# Patient Record
Sex: Female | Born: 1995 | Hispanic: Yes | Marital: Married | Smoking: Never smoker
Health system: Southern US, Community
[De-identification: ages and names within clinical notes are randomized; demographics above are authoritative.]

## PROBLEM LIST (undated history)

## (undated) DIAGNOSIS — Z789 Other specified health status: Secondary | ICD-10-CM

## (undated) HISTORY — PX: NO PAST SURGERIES: SHX2092

---

## 2016-05-10 ENCOUNTER — Ambulatory Visit (INDEPENDENT_AMBULATORY_CARE_PROVIDER_SITE_OTHER): Payer: Managed Care, Other (non HMO) | Admitting: Osteopathic Medicine

## 2016-05-10 ENCOUNTER — Encounter: Payer: Self-pay | Admitting: Osteopathic Medicine

## 2016-05-10 VITALS — BP 126/73 | HR 79 | Ht 59.0 in | Wt 123.0 lb

## 2016-05-10 DIAGNOSIS — Z113 Encounter for screening for infections with a predominantly sexual mode of transmission: Secondary | ICD-10-CM

## 2016-05-10 DIAGNOSIS — Z0189 Encounter for other specified special examinations: Secondary | ICD-10-CM | POA: Diagnosis not present

## 2016-05-10 DIAGNOSIS — Z124 Encounter for screening for malignant neoplasm of cervix: Secondary | ICD-10-CM | POA: Diagnosis not present

## 2016-05-10 DIAGNOSIS — Z0000 Encounter for general adult medical examination without abnormal findings: Secondary | ICD-10-CM

## 2016-05-10 DIAGNOSIS — Z23 Encounter for immunization: Secondary | ICD-10-CM

## 2016-05-10 DIAGNOSIS — R010 Benign and innocent cardiac murmurs: Secondary | ICD-10-CM | POA: Diagnosis not present

## 2016-05-10 DIAGNOSIS — Z008 Encounter for other general examination: Secondary | ICD-10-CM

## 2016-05-10 DIAGNOSIS — R350 Frequency of micturition: Secondary | ICD-10-CM | POA: Diagnosis not present

## 2016-05-10 LAB — CBC WITH DIFFERENTIAL/PLATELET
BASOS PCT: 1 %
Basophils Absolute: 57 cells/uL (ref 0–200)
EOS ABS: 171 {cells}/uL (ref 15–500)
Eosinophils Relative: 3 %
HCT: 40.3 % (ref 35.0–45.0)
Hemoglobin: 13.2 g/dL (ref 11.7–15.5)
Lymphocytes Relative: 42 %
Lymphs Abs: 2394 cells/uL (ref 850–3900)
MCH: 29.9 pg (ref 27.0–33.0)
MCHC: 32.8 g/dL (ref 32.0–36.0)
MCV: 91.2 fL (ref 80.0–100.0)
MONOS PCT: 7 %
MPV: 11.6 fL (ref 7.5–12.5)
Monocytes Absolute: 399 cells/uL (ref 200–950)
NEUTROS ABS: 2679 {cells}/uL (ref 1500–7800)
Neutrophils Relative %: 47 %
PLATELETS: 243 10*3/uL (ref 140–400)
RBC: 4.42 MIL/uL (ref 3.80–5.10)
RDW: 13 % (ref 11.0–15.0)
WBC: 5.7 10*3/uL (ref 3.8–10.8)

## 2016-05-10 LAB — POCT URINALYSIS DIPSTICK
Bilirubin, UA: NEGATIVE
GLUCOSE UA: NEGATIVE
Ketones, UA: NEGATIVE
NITRITE UA: NEGATIVE
PH UA: 7.5
PROTEIN UA: NEGATIVE
Spec Grav, UA: 1.015
UROBILINOGEN UA: 0.2

## 2016-05-10 NOTE — Patient Instructions (Signed)
Plan:  You should receive a call about the echocardiogram to evaluate the heart murmur We will send the paperwork for your blood tests once we have the results Please bring us any vaccine records you might have Will call you with lab results    Soplo cardaco (Heart Murmur) Un soplo cardaco es un sonido adicional que su mdico oye cuando escucha su corazn con un dispositivo denominado estetoscopio. El sonido proviene de una turbulencia provocada cuando la sangre circula a travs del corazn y el sonido puede ser como un zumbido o un silbido cuando el corazn late. Existen dos tipos de soplos cardacos:  Soplo cardaco inocente. La mayora de las personas con este tipo de soplo no tienen un problema cardaco. Muchos nios tienen soplos cardacos inocentes. Su mdico puede sugerir Jabil Circuitalgunos estudios bsicos para saber si el soplo es un soplo inocente. Si se descubre un soplo cardaco inocente, no hay necesidad de Sears Holdings Corporationhacer otros estudios, Tax inspectoradministrar tratamiento, restringir las actividades ni suspender la prctica de deportes.  Soplo cardaco anormal. Este tipo de soplo cardaco puede aparecer en nios y adultos. En los nios, por lo general los soplos cardacos anormales son causados por defectos cardacos que estn presentes desde el nacimiento (congnitos). En los adultos, los soplos anormales normalmente provienen de problemas en las vlvulas cardacas causados por una enfermedad, infeccin o el envejecimiento. CAUSAS Normalmente, estas vlvulas se abren para dejar que la sangre circule a travs o fuera del corazn y Engineer, miningluego se cierran para evitar que la sangre regrese. Si las vlvulas no funcionan correctamente, puede experimentar los siguientes sntomas:  Regurgitacin: cuando la sangre se filtra a travs de la vlvula en la direccin incorrecta.  Prolapso de la vlvula mitral: cuando la vlvula mitral del corazn tiene una aleta suelta y no se cierra hermticamente.  Estenosis: cuando la vlvula  no se abre lo suficiente y bloquea el flujo sanguneo. Blake DivineSIGNOS Y SNTOMAS Los soplos inocentes no provocan sntomas, y Alexandratownmuchas personas con soplos anormales pueden tener sntomas o no. Si hay sntomas, pueden ser:  Harrel LemonFalta de aire.  Color azul en la piel, especialmente en las puntas de los dedos.  Dolor en el pecho.  Palpitaciones, sentir un aleteo o latido cardaco irregular.  Desmayos.  Tos persistente.  Cansarse con mucha ms rapidez de lo esperado. DIAGNSTICO Un soplo cardaco se puede escuchar durante un examen mdico deportivo o durante otro tipo de examen. Cuando se escucha un soplo, esto puede sugerir un posible problema. Cuando esto ocurre, es posible que el mdico le recomiende ver a Journalist, newspaperun especialista cardaco (cardilogo). Tambin le pueden pedir que se realice uno o ms estudios cardacos. En Franklin Resourcesestos casos, los estudios pueden variar segn lo que escuch su mdico. Estos son algunos de los estudios indicados para un soplo cardaco:  Materials engineerlectrocardiograma.  Ecocardiograma.  Resonancia magntica. Para nios y adultos que tienen un soplo cardaco anormal y desean Microbiologistpracticar deportes, es importante completar los estudios, Chiropractoranalizar los Clovisresultados con su mdico y seguir sus TEFL teacherrecomendaciones. Si hay una enfermedad cardaca, es posible que no sea seguro practicar un deporte. TRATAMIENTO Los soplos inocentes no requieren tratamiento ni restriccin de actividades. Si un soplo anormal representa un problema en el corazn, el tratamiento depender de la naturaleza exacta del problema. En Franklin Resourcesestos casos, es posible que sean necesarios medicamentos o ciruga para tratar el problema. INSTRUCCIONES PARA EL CUIDADO EN EL HOGAR Si desea practicar un deporte u otros tipos de [place] Vincent and the Grenadinesactividad fsica intensa, es importante analizar esta situacin primero con su mdico. Si el  soplo representa un problema en el corazn y decide practicar un deporte, existe una pequea probabilidad de que suceda un problema grave  (incluida muerte sbita). SOLICITE ATENCIN MDICA SI:  Siente que sus sntomas empeoran lentamente.  Desarrolla sntomas nuevos que le preocupan.  Siente que los medicamentos recetados le provocan efectos secundarios. SOLICITE ATENCIN MDICA DE INMEDIATO SI:  Siente dolor en el pecho.  Le falta el aire.  Observa que su corazn late de forma irregular, con una frecuencia preocupante  Sufre episodios de [employer].  Los sntomas empeoran repentinamente. Esta informacin no tiene Theme park manager el consejo del mdico. Asegrese de hacerle al mdico cualquier pregunta que tenga. Document Released: 02/15/2005 Document Revised: 03/08/2014 Document Reviewed: 08/21/2015 Elsevier Interactive Patient Education  2017 [employer].

## 2016-05-10 NOTE — Progress Notes (Signed)
HPI: Kristin Mcdaniel is a 21 y.o. female  who presents to [facility] today, 05/10/16,  for chief complaint of:  Chief Complaint  Patient presents with  . Annual Exam    See below for review of preventive care   Urinary frequency - no dysuria, thinks may be due to holding her urine while at work. Trace leuks on UA in office. No fever or nausea, no abd pain.   STI check - would like to be tested. Never had Pap. Declines Pap today.   Interpreter present and assists with history.    Past medical, surgical, social and family history reviewed: There are no active problems to display for this patient.  History reviewed. No pertinent surgical history. Social History  Substance Use Topics  . Smoking status: Never Smoker  . Smokeless tobacco: Never Used  . Alcohol use No     Comment: occasional, social drinking, limited    Family History  Problem Relation Age of Onset  . Diabetes Mother   . Hyperlipidemia Mother   . Diabetes Maternal Grandmother   . Hyperlipidemia Maternal Grandmother      Current medication list and allergy/intolerance information reviewed:   No current outpatient prescriptions on file.   No current facility-administered medications for this visit.    No Known Allergies    Review of Systems:  Constitutional:  No  fever, no chills, No recent illness  HEENT: No  headache, no vision change, no hearing change, No sore throat, No  sinus pressure  Cardiac: No  chest pain, No  pressure, No palpitations, No  Orthopnea  Respiratory:  No  shortness of breath. No  Cough  Gastrointestinal: No  abdominal pain, No  nausea, No  vomiting,  No  blood in stool, No  diarrhea, No  constipation   Musculoskeletal: No new myalgia/arthralgia  Skin: No  Rash, No other wounds/concerning lesions  Neurologic: No  weakness, No  dizziness  Psychiatric: No  concerns with depression, No  concerns with anxiety, No sleep problems, No mood  problems  Exam:  BP 126/73   Pulse 79   Ht 4\' 11"  (1.499 m)   Wt 123 lb (55.8 kg)   BMI 24.84 kg/m   Constitutional: VS see above. General Appearance: alert, well-developed, well-nourished, NAD  Eyes: Normal lids and conjunctive, non-icteric sclera  Ears, Nose, Mouth, Throat: MMM, Normal external inspection ears/nares/mouth/lips/gums. TM normal bilaterally. Pharynx/tonsils no erythema, no exudate. Nasal mucosa normal.   Neck: No masses, trachea midline. No thyroid enlargement. No tenderness/mass appreciated. No lymphadenopathy  Respiratory: Normal respiratory effort. no wheeze, no rhonchi, no rales  Cardiovascular: S1/S2 normal, +1 murmur, no rub/gallop auscultated. RRR. No lower extremity edema.   Gastrointestinal: Nontender, no masses. No hepatomegaly, no splenomegaly. No hernia appreciated. Bowel sounds normal. Rectal exam deferred.   Musculoskeletal: Gait normal. No clubbing/cyanosis of digits.   Neurological: Normal balance/coordination. No tremor. No cranial nerve deficit on limited exam.  Skin: warm, dry, intact. No rash/ulcer.  Psychiatric: Normal judgment/insight. Normal mood and affect. Oriented x3.     FEMALE PREVENTIVE CARE Updated 05/10/16   ANNUAL SCREENING/COUNSELING  Diet/Exercise - HEALTHY HABITS DISCUSSED TO DECREASE CV RISK History  Smoking Status  . Never Smoker  Smokeless Tobacco  . Never Used   History  Alcohol Use No    Comment: occasional, social drinking, limited    No flowsheet data found.  Domestic violence concerns - no  HTN SCREENING - SEE VITALS  SEXUAL HEALTH  Sexually active  in the past year - Yes with female.  Need/want STI testing today? - yes  Concerns about libido or pain with sex? - no  Plans for pregnancy? - none, BC at this time is condoms  INFECTIOUS DISEASE SCREENING  HIV - needs  GC/CT - needs  HepC - DOB 1946-1965 - does not need  TB - does not need  DISEASE SCREENING  Lipid - needs for  paperwork  DM2 - needs for paperwork  Osteoporosis - women age 27+ - does not need  CANCER SCREENING  Cervical - needs - never had Pap   Breast - does not need  Lung - does not need  Colon - does not need  ADULT VACCINATION  Immigrant wellness check - got three vaccines, not sure which ones    Influenza - annual vaccine recommended - never had vaccine   Td - booster every 10 years   Zoster - option at 4950, yes at 60+   PCV13 - was not indicated  PPSV23 - was not indicated  OTHER  Fall - exercise and Vit D age 27+ - does not need  Consider ASA - age 22-59 - does not need  Results for orders placed or performed in visit on 05/10/16 (from the past 24 hour(s))  POCT Urinalysis Dipstick     Status: Abnormal   Collection Time: 05/10/16  2:09 PM  Result Value Ref Range   Color, UA YELLOW    Clarity, UA CLEAR    Glucose, UA NEGATIVE    Bilirubin, UA NEGATIVE    Ketones, UA NEGATIVE    Spec Grav, UA 1.015 1.003, 1.005, 1.010, 1.015, 1.020, 1.025, 1.030, 1.035   Blood, UA SMALL    pH, UA 7.5 5.0, 5.5, 6.0, 6.5, 7.0, 7.5, 8.0   Protein, UA NEGATIVE    Urobilinogen, UA 0.2 0.2, 1.0   Nitrite, UA NEGATIVE    Leukocytes, UA Trace (A) Negative     ASSESSMENT/PLAN:   Annual physical exam - Plan: COMPLETE METABOLIC PANEL WITH GFR, Lipid panel  Need for influenza vaccination - Plan: Flu Vaccine QUAD 36+ mos IM, CANCELED: POCT Influenza A/B  Encounter for biometric screening - Plan: COMPLETE METABOLIC PANEL WITH GFR, Lipid panel  Routine screening for STI (sexually transmitted infection) - Plan: Hepatitis B core antibody, total, Hepatitis B surface antibody, Hepatitis B surface antigen, Hepatitis C antibody, RPR, HIV antibody, GC/Chlamydia Probe Amp  Urinary frequency - Plan: POCT Urinalysis Dipstick, Urine Culture  Benign heart murmur - Plan: ECHOCARDIOGRAM COMPLETE, CBC with Differential/Platelet, TSH  Cervical cancer screening - declines Pap today, due for this.  Will come back for it.     Patient Instructions  Plan:  You should receive a call about the echocardiogram to evaluate the heart murmur We will send the paperwork for your blood tests once we have the results Please bring us any vaccine records you might have Will call you with lab results   Interpreter reviewed AVS with patient    Visit summary with medication list and pertinent instructions was printed for patient to review. All questions at time of visit were answered - patient instructed to contact office with any additional concerns. ER/RTC precautions were reviewed with the patient. Follow-up plan: Return for Pap test at your convenience, annual physical every year, sooner if needed .

## 2016-05-11 LAB — COMPLETE METABOLIC PANEL WITH GFR
ALBUMIN: 4.6 g/dL (ref 3.6–5.1)
ALK PHOS: 60 U/L (ref 33–115)
ALT: 31 U/L — AB (ref 6–29)
AST: 17 U/L (ref 10–30)
BUN: 11 mg/dL (ref 7–25)
CALCIUM: 9.7 mg/dL (ref 8.6–10.2)
CHLORIDE: 104 mmol/L (ref 98–110)
CO2: 25 mmol/L (ref 20–31)
CREATININE: 0.66 mg/dL (ref 0.50–1.10)
GFR, Est Non African American: >89 mL/min (ref >=60)
Glucose, Bld: 83 mg/dL (ref 65–99)
POTASSIUM: 3.7 mmol/L (ref 3.5–5.3)
Sodium: 139 mmol/L (ref 135–146)
Total Bilirubin: 0.8 mg/dL (ref 0.2–1.2)
Total Protein: 7.4 g/dL (ref 6.1–8.1)

## 2016-05-11 LAB — HEPATITIS B SURFACE ANTIGEN: HEP B S AG: NEGATIVE

## 2016-05-11 LAB — LIPID PANEL
Cholesterol: 180 mg/dL (ref <200)
HDL: 64 mg/dL (ref >50)
LDL Cholesterol: 99 mg/dL (ref <100)
TRIGLYCERIDES: 83 mg/dL (ref <150)
Total CHOL/HDL Ratio: 2.8 Ratio (ref <5.0)
VLDL: 17 mg/dL (ref <30)

## 2016-05-11 LAB — GC/CHLAMYDIA PROBE AMP
CT PROBE, AMP APTIMA: NOT DETECTED
GC PROBE AMP APTIMA: NOT DETECTED

## 2016-05-11 LAB — TSH: TSH: 1.64 m[IU]/L

## 2016-05-11 LAB — RPR

## 2016-05-11 LAB — URINE CULTURE

## 2016-05-11 LAB — HEPATITIS C ANTIBODY: HCV AB: NEGATIVE

## 2016-05-11 LAB — HEPATITIS B CORE ANTIBODY, TOTAL: Hep B Core Total Ab: NONREACTIVE

## 2016-05-11 LAB — HIV ANTIBODY (ROUTINE TESTING W REFLEX): HIV: NONREACTIVE

## 2016-05-11 LAB — HEPATITIS B SURFACE ANTIBODY,QUALITATIVE: Hep B S Ab: NEGATIVE

## 2016-06-02 ENCOUNTER — Ambulatory Visit (HOSPITAL_BASED_OUTPATIENT_CLINIC_OR_DEPARTMENT_OTHER)
Admission: RE | Admit: 2016-06-02 | Discharge: 2016-06-02 | Disposition: A | Discharge status: Home / Self Care | Payer: Managed Care, Other (non HMO) | Source: Ambulatory Visit | Attending: Osteopathic Medicine | Admitting: Osteopathic Medicine

## 2016-06-02 DIAGNOSIS — R010 Benign and innocent cardiac murmurs: Secondary | ICD-10-CM | POA: Diagnosis present

## 2016-06-02 LAB — ECHOCARDIOGRAM COMPLETE
CHL CUP DOP CALC LVOT VTI: 24.8 cm
E decel time: 156 msec
E/e' ratio: 6.85
FS: 36 % (ref 28–44)
IV/PV OW: 1.09
LA ID, A-P, ES: 28 mm
LADIAMINDEX: 1.87 cm/m2
LAVOL: 26.2 mL
LAVOLA4C: 28.3 mL
LAVOLIN: 17.5 mL/m2
LEFT ATRIUM END SYS DIAM: 28 mm
LV E/e' medial: 6.85
LV PW d: 6.94 mm — AB (ref 0.6–1.1)
LV e' LATERAL: 16.8 cm/s
LVEEAVG: 6.85
LVOT SV: 78 mL
LVOT area: 3.14 cm2
LVOT peak vel: 111 cm/s
LVOTD: 20 mm
Lateral S' vel: 14.6 cm/s
MV Dec: 156
MV pk E vel: 115 m/s
MVPG: 5 mmHg
MVPKAVEL: 82.6 m/s
RV TAPSE: 24.3 mm
TDI e' lateral: 16.8
TDI e' medial: 10.9

## 2016-06-02 NOTE — Progress Notes (Signed)
Echocardiogram 2D Echocardiogram has been performed.  Delcie Roch 06/02/2016, 2:51 PM

## 2017-03-01 NOTE — L&D Delivery Note (Addendum)
Patient: Kristin Mcdaniel MRN: [id number]  GBS status: negative, IAP given amp + gent for a intrapartum temperature elevation of 100.9 prior to delivery  Patient is a 22 y.o. now G1P1 s/p NSVD at 220w3d, who was admitted for SOL. AROM 10h 3049m prior to delivery with clear fluid. Patient had prolonged active labor with good effort with pushing during contractions. Dr. Debroah LoopArnold was paged to evaluate for need for vacuum assistance. Benefits and risks were discussed with the patient and she was in understanding and agreement with the plan. Kiwi vacuum device was applied to baby's skull and traction was applied through ~5 contractions until head was delivered. Infant was placed on mother's chest and rubbed vigorously with spontaneous cry.  Interpreter was used through [employer] interpretation services [phone].  Delivery Note At 11:42 PM a viable female was delivered via Vaginal, Vacuum (Extractor) (Presentation: vertex ; ROA ).  APGAR: 6, 9; weight  pending.   Placenta status: intact .  Cord: 3 vessel with the following complications: 3rd degree laceration and VAVD.  Cord pH: pending  Anesthesia:  Epidural  Episiotomy: None Lacerations: 3rd degree Suture Repair: 3.0 vicryl Est. Blood Loss (mL):    Mom to postpartum.  Baby to Couplet care / Skin to Skin.  Kristin Mcdaniel 02/02/2018, 12:07 AM   Head delivered ROA with vacuum assistance by Dr Debroah LoopArnold    No pop-offs. No nuchal cord present. Shoulder and body delivered in usual fashion, but required McRoberts and various attempts at traction to deliver   Less than 60 seconds to get them delivered.  Baby moving arms well with no deficits or clavicle issues (examined by Neonatologist). Infant with spontaneous cry, placed on mother's abdomen, dried and bulb suctioned. Cord clamped x 2 after 1-minute delay, and cut by father. Cord blood drawn. Placenta delivered spontaneously with gentle cord traction. Fundus firm with massage and Pitocin. Perineum inspected and  found to have 3rd degree laceration, which was repaired with ~10 sutures with good hemostasis achieved.  I confirm that I have verified the information documented in the resident's note and that I have also personally reperformed the physical exam and all medical decision making activities.  I was gloved and present for entire delivery NICU team was present for delivery  VAVD by Dr Francoise CeoArnoldwithout incident Mild difficulty with shoulders relieved by McRoberts and various approaches to delivery of shoulders. Lacerations as listed above Repair of same done by Dr Debroah LoopArnold.  Kristin Mcdaniel, CNM  Please schedule this patient for Postpartum visit in: 4 weeks with the following provider: Any provider For C/S patients schedule nurse incision check in weeks 2 weeks: no Low risk pregnancy complicated by: isolated elevated BPs in labor with normal labs Delivery mode:  Vacuum Anticipated Birth Control:  POPs PP Procedures needed: BP check  Schedule Integrated BH visit: no

## 2017-05-13 ENCOUNTER — Encounter: Payer: Self-pay | Admitting: Osteopathic Medicine

## 2017-05-13 ENCOUNTER — Other Ambulatory Visit (HOSPITAL_COMMUNITY)
Admission: RE | Admit: 2017-05-13 | Discharge: 2017-05-13 | Disposition: A | Discharge status: Home / Self Care | Payer: 59 | Source: Ambulatory Visit | Attending: Family Medicine | Admitting: Family Medicine

## 2017-05-13 ENCOUNTER — Ambulatory Visit (INDEPENDENT_AMBULATORY_CARE_PROVIDER_SITE_OTHER): Payer: 59 | Admitting: Osteopathic Medicine

## 2017-05-13 VITALS — BP 113/82 | HR 57 | Temp 98.4°F | Wt 141.1 lb

## 2017-05-13 DIAGNOSIS — Z124 Encounter for screening for malignant neoplasm of cervix: Secondary | ICD-10-CM | POA: Diagnosis not present

## 2017-05-13 DIAGNOSIS — Z0000 Encounter for general adult medical examination without abnormal findings: Secondary | ICD-10-CM | POA: Diagnosis not present

## 2017-05-13 DIAGNOSIS — Z0189 Encounter for other specified special examinations: Secondary | ICD-10-CM | POA: Diagnosis not present

## 2017-05-13 DIAGNOSIS — M545 Low back pain, unspecified: Secondary | ICD-10-CM

## 2017-05-13 DIAGNOSIS — Z113 Encounter for screening for infections with a predominantly sexual mode of transmission: Secondary | ICD-10-CM

## 2017-05-13 DIAGNOSIS — Z008 Encounter for other general examination: Secondary | ICD-10-CM

## 2017-05-13 NOTE — Progress Notes (Signed)
HPI: Kristin Mcdaniel is a 22 y.o. female who  has no past medical history on file.  she presents to [facility] today, 05/13/17,  for chief complaint of: Annual physical and Pap    Patient here for annual physical / wellness exam.  See preventive care reviewed as below.  Recent labs reviewed in detail with the patient.   Additional concerns today include: None  Appreciate assistance of Jola Babinski, CMA, with translation      Past medical, surgical, social and family history reviewed:  Patient Active Problem List   Diagnosis Date Noted  . Benign heart murmur 05/10/2016  . Urinary frequency 05/10/2016  . Routine screening for STI (sexually transmitted infection) 05/10/2016    No past surgical history on file.  Social History   Tobacco Use  . Smoking status: Never Smoker  . Smokeless tobacco: Never Used  Substance Use Topics  . Alcohol use: No    Comment: occasional, social drinking, limited     Family History  Problem Relation Age of Onset  . Diabetes Mother   . Hyperlipidemia Mother   . Diabetes Maternal Grandmother   . Hyperlipidemia Maternal Grandmother      Current medication list and allergy/intolerance information reviewed:    No current outpatient medications on file.   No current facility-administered medications for this visit.     No Known Allergies    Review of Systems:  Constitutional:  No  fever, no chills, No recent illness  HEENT: No  headache  Cardiac: No  chest pain,  Respiratory:  No  shortness of breath.   Gastrointestinal: No  abdominal pain  Musculoskeletal: No new myalgia/arthralgia, occasional low back pain   Neurologic: No  weakness  Psychiatric: No  concerns with depression, No  concerns with anxiety  Exam:  BP 113/82   Pulse (!) 57   Temp 98.4 F (36.9 C) (Oral)   Wt 141 lb 1.3 oz (64 kg)   BMI 28.49 kg/m   Constitutional: VS see above. General Appearance: alert,  well-developed, well-nourished, NAD  Eyes: Normal lids and conjunctive, non-icteric sclera  Ears, Nose, Mouth, Throat: MMM, Normal external inspection ears/nares/mouth/lips/gums.   Neck: No masses, trachea midline. No thyroid enlargement. No tenderness/mass appreciated. No lymphadenopathy  Respiratory: Normal respiratory effort. no wheeze, no rhonchi, no rales  Cardiovascular: S1/S2 normal, no murmur, no rub/gallop auscultated. RRR. No lower extremity edema  Gastrointestinal: Nontender, no masses. No hepatomegaly, no splenomegaly. No hernia appreciated. Bowel sounds normal. Rectal exam deferred.   Musculoskeletal: Gait normal. No clubbing/cyanosis of digits.   Neurological: Normal balance/coordination. No tremor. No cranial nerve deficit on limited exam. Motor and sensation intact and symmetric. Cerebellar reflexes intact.   Skin: warm, dry, intact. No rash/ulcer. No concerning nevi or subq nodules on limited exam.    Psychiatric: Normal judgment/insight. Normal mood and affect. Oriented x3.  GYN: No lesions/ulcers to external genitalia, normal urethra, normal vaginal mucosa, physiologic discharge, cervix normal without lesions, uterus not enlarged or tender, adnexa no masses and nontender  BREAST: No rashes/skin changes, normal fibrous breast tissue, no masses or tenderness, normal nipple without discharge, normal axilla      ASSESSMENT/PLAN:   Annual physical exam - Plan: CBC, COMPLETE METABOLIC PANEL WITH GFR, Lipid panel  Encounter for biometric screening - Plan: COMPLETE METABOLIC PANEL WITH GFR, Lipid panel  Routine screening for STI (sexually transmitted infection) - Plan: HIV antibody, RPR, Hepatitis B surface antigen, Hepatitis B core antibody, total, Hepatitis C Antibody  Cervical cancer screening - Plan: Cytology - PAP, C. trachomatis/N. gonorrhoeae RNA  Right-sided low back pain without sciatica, unspecified chronicity - exercises printed     FEMALE PREVENTIVE  CARE Updated 05/13/17   ANNUAL SCREENING/COUNSELING  Diet/Exercise - HEALTHY HABITS DISCUSSED TO DECREASE CV RISK Social History   Tobacco Use  Smoking Status Never Smoker  Smokeless Tobacco Never Used    Social History   Substance and Sexual Activity  Alcohol Use No   Comment: occasional, social drinking, limited     Depression screen PHQ 2/9 05/13/2017  Decreased Interest 1  Down, Depressed, Hopeless 1  PHQ - 2 Score 2  Altered sleeping 0  Tired, decreased energy 2  Change in appetite 0  Feeling bad or failure about yourself  0  Trouble concentrating 0  Moving slowly or fidgety/restless 0  Suicidal thoughts 0  PHQ-9 Score 4  Difficult doing work/chores Not difficult at all   Domestic violence concerns - no  HTN SCREENING - SEE VITALS  SEXUAL HEALTH  Sexually active in the past year - Yes with female.  Need/want STI testing today? - yes  Concerns about libido or pain with sex? - no  Plans for pregnancy? - none  INFECTIOUS DISEASE SCREENING  HIV - needs  GC/CT - needs  HepC - DOB 1946-1965 - does not need  TB - does not need  DISEASE SCREENING  Lipid - needs  DM2 - needs  Osteoporosis - women age 40+ - does not need  CANCER SCREENING  Cervical - needs  Breast - does not need  Lung - does not need  Colon - does not need  ADULT VACCINATION  Influenza - annual vaccine recommended  Td - booster every 10 years   Zoster - Shingrix recommended 50+  PCV13 - was not indicated  PPSV23 - was not indicated Immunization History  Administered Date(s) Administered  . Influenza,inj,Quad PF,6+ Mos 05/10/2016    OTHER  Fall - exercise and Vit D age 40+ - does not need        Visit summary with medication list and pertinent instructions was printed for patient to review. All questions at time of visit were answered - patient instructed to contact office with any additional concerns. ER/RTC precautions were reviewed with the patient.    Follow-up plan: Return in about 1 year (around 05/14/2018). Annual check-up    Please note: voice recognition software was used to produce this document, and typos may escape review. Please contact Dr. Lyn HollingsheadAlexander for any needed clarifications.

## 2017-05-13 NOTE — Patient Instructions (Signed)
Low Back Sprain With Rehab Low Back Sprain Rehab Consulte al mdico qu ejercicios son seguros para usted. Haga los ejercicios exactamente como se lo haya indicado el mdico y gradelos como se lo hayan indicado. Es normal sentir un leve estiramiento, tirn, rigidez o molestia cuando haga estos ejercicios, pero debe detenerse de inmediato si siento un dolor repentino o si el dolor empeora. No comience a hacer estos ejercicios hasta que se lo indique el mdico. EJERCICIOS DE ELONGACIN Y AMPLITUD DE MOVIMIENTOS Estos ejercicios calientan los msculos y las articulaciones, y mejoran el movimiento y la flexibilidad de la espalda. Estos ejercicios tambin ayudan a aliviar el dolor, el adormecimiento y el hormigueo. Ejercicio A: Rotacin lumbar 1. Acustese boca arriba sobre un superficie firme y flexione las rodillas. 2. Coloque los brazos extendidos a los lados de modo que cada brazo forme una "L" con el cuerpo (un ngulo de 90 grados). 3. Mueva lentamente ambas rodillas hacia un lado del cuerpo hasta que sienta un estiramiento en la parte inferior de la espalda. Trate de no despegar los hombros del piso. 4. Mantenga esta posicin durante __________ segundos. 5. Tensione los msculos abdominales y lentamente lleve las rodillas a la posicin inicial. 6. Repita este ejercicio del otro lado del cuerpo.  Repita __________ veces. Realice este ejercicio __________ veces al da. Ejercicio B: Extensin sobre los codos, en decbito prono 1. Acustese boca abajo sobre una superficie firme. 2. Apyese sobre los codos. 3. Con los brazos, aydese a levantar el pecho hasta sentir un leve estiramiento en el abdomen y la parte inferior de la espalda. ? Esto colocar algo de peso sobre los codos. Si no se siente cmodo, intente colocando almohadas debajo del pecho. ? Debe dejar la cadera inmvil sobre la superficie en la que est apoyado. Mantenga la cadera y los msculos de la espalda relajados. 4. Mantenga esta  posicin durante __________ segundos. 5. Afloje lentamente la parte superior del cuerpo y vuelva a la posicin inicial.  Repita __________ veces. Realice este ejercicio __________ veces al da. EJERCICIOS DE FORTALECIMIENTO Estos ejercicios fortalecen la espalda y le otorgan resistencia. La resistencia es la capacidad de usar los msculos durante un tiempo prolongado, incluso despus de que se cansen. Ejercicio C: Inclinacin de la pelvis 1. Acustese boca arriba sobre una superficie firme. Flexione las rodillas y mantenga los pies apoyados. 2. Tensione los msculos abdominales. Eleve la pelvis hacia el techo y aplane la parte inferior de la espalda contra el suelo. ? Para realizar este ejercicio, puede colocar una toalla pequea debajo de la parte inferior de la espalda y presionar la espalda contra la toalla. 3. Mantenga esta posicin durante __________ segundos. 4. Relaje totalmente los msculos antes de repetir el ejercicio.  Repita __________ veces. Realice este ejercicio __________ veces al da. Ejercicio D: Elevaciones alternadas de pierna y brazo 1. Apoye las palmas de las manos y las rodillas sobre una superficie firme. Si se colocar sobre una superficie muy dura, puede usar un elemento acolchado para apoyar las rodillas, como una alfombrilla para ejercicios. 2. Alinee los brazos y las piernas. Las manos deben estar debajo de los hombros y las rodillas debajo de la cadera. 3. Eleve la pierna izquierda hacia atrs. Al mismo tiempo, eleve el brazo derecho y estrelo frente a usted. ? No eleve la pierna por encima de la cadera. ? No eleve el brazo por encima del hombro. ? Mantenga los msculos del abdomen y de la espalda contrados. ? Mantenga la cadera mirando hacia el   suelo. ? No arquee la espalda. ? Mantenga el equilibrio con cuidado y no contenga la respiracin. 4. Mantenga esta posicin durante __________ segundos. 5. Lentamente regrese a la posicin inicial y repita el ejercicio  con la pierna derecha y el brazo izquierdo.  Repita __________ veces. Realice este ejercicio __________ veces al da. Ejercicio E: Serie de abdominales con levantamiento de pierna extendida 1. Acustese boca arriba sobre una superficie firme. 2. Flexione una rodilla y mantenga la otra pierna extendida. 3. Tensione los msculos abdominales y levante la pierna extendida, a unas 4 o 6 pulgadas (10 o 15 cm) del suelo. 4. Mantenga apretados los msculos abdominales y sostenga durante __________ segundos. ? No contenga la respiracin. ? No arquee la espalda. Mantngala plana contra el suelo. 5. Mantenga tensos los msculos abdominales mientras baja lentamente la pierna hasta la posicin inicial. 6. Repita el ejercicio con la otra pierna.  Repita __________ veces. Realice este ejercicio __________ veces al da. POSTURA Y MECNICA CORPORAL La mecnica corporal se refiere a los movimientos y a las posiciones del cuerpo mientras realiza las actividades diarias. La postura es una parte de la mecnica corporal. La buena postura y la mecnica corporal saludable pueden ayudar a aliviar el estrs en las articulaciones y los tejidos del cuerpo. La buena postura significa que la columna mantiene su posicin natural de curvatura en forma de S (la columna est en una posicin neutral), los hombros van un poco hacia atrs y la cabeza no se inclina hacia adelante. A continuacin, se incluyen pautas generales para mejorar la postura y la mecnica corporal en las actividades diarias. De pie  Al estar de pie, mantenga la columna en la posicin neutral y los pies separados al ancho de caderas, aproximadamente. Mantenga las rodillas ligeramente flexionadas. Las orejas, los hombros y las caderas deben estar alineados.  Cuando realice una tarea en la que deba estar de pie en el mismo sitio durante mucho tiempo, coloque un pie en un objeto estable de 2 a 4 pulgadas (5 a 10 cm) de alto, como un taburete. Esto ayuda a que la  columna mantenga una posicin neutral.  Sentado  Cuando est sentado, mantenga la columna en posicin neutral y deje los pies apoyados en el suelo. Use un apoyapis, si es necesario, y mantenga los muslos paralelos al suelo. Evite redondear los hombros e inclinar la cabeza hacia adelante.  Cuando trabaje en un escritorio o con una computadora, el escritorio debe estar a una altura en la que las manos estn un poco ms abajo que los codos. Deslice la silla debajo del escritorio, de modo de estar lo suficientemente cerca como para mantener una buena postura.  Cuando trabaje con una computadora, coloque el monitor a una altura que le permita mirar derecho hacia adelante, sin tener que inclinar la cabeza hacia adelante o hacia atrs.  Reposo Al descansar o estar acostado, evite las posiciones que le causen ms dolor.  Si siente dolor al hacer actividades que exigen sentarse, inclinarse, agacharse o ponerse en cuclillas (actividades basadas en la flexin), acustese en una posicin en la que el cuerpo no deba doblarse mucho. Por ejemplo, evite acurrucarse de costado con los brazos y las rodillas cerca del pecho (posicin fetal).  Si siente dolor con las actividades que exigen estar de pie durante mucho tiempo o estirar los brazos (actividades basadas en la extensin), acustese con la columna en una posicin neutral y flexione ligeramente las rodillas. Pruebe con las siguientes posiciones: ? Acostarse de costado con   una almohada entre las rodillas. ? Acostarse boca arriba con una almohada debajo de las rodillas.  Levantar objetos  Cuando tenga que levantar un objeto, mantenga los pies separados el ancho de los hombros y apriete los msculos abdominales.  Flexione las rodillas y la cadera, y mantenga la columna en posicin neutral. Es importante levantar utilizando la fuerza de las piernas, no de la espalda. No trabe las rodillas hacia afuera.  Siempre pida ayuda a otra persona para levantar  objetos pesados o incmodos.  Esta informacin no tiene como fin reemplazar el consejo del mdico. Asegrese de hacerle al mdico cualquier pregunta que tenga. Document Released: 12/02/2005 Document Revised: 07/02/2014 Document Reviewed: 11/27/2014 Elsevier Interactive Patient Education  2018 [employer]  

## 2017-05-14 LAB — C. TRACHOMATIS/N. GONORRHOEAE RNA
C. TRACHOMATIS RNA, TMA: NOT DETECTED
N. gonorrhoeae RNA, TMA: NOT DETECTED

## 2017-05-16 LAB — CYTOLOGY - PAP: Diagnosis: NEGATIVE

## 2017-05-16 LAB — LIPID PANEL
CHOL/HDL RATIO: 3.6 (calc) (ref <5.0)
CHOLESTEROL: 185 mg/dL (ref <200)
HDL: 51 mg/dL (ref >50)
LDL Cholesterol (Calc): 119 mg/dL (calc) — ABNORMAL HIGH
NON-HDL CHOLESTEROL (CALC): 134 mg/dL — AB (ref <130)
Triglycerides: 49 mg/dL (ref <150)

## 2017-05-16 LAB — CBC
HEMATOCRIT: 37.4 % (ref 35.0–45.0)
HEMOGLOBIN: 12.9 g/dL (ref 11.7–15.5)
MCH: 30.2 pg (ref 27.0–33.0)
MCHC: 34.5 g/dL (ref 32.0–36.0)
MCV: 87.6 fL (ref 80.0–100.0)
MPV: 12 fL (ref 7.5–12.5)
Platelets: 244 10*3/uL (ref 140–400)
RBC: 4.27 10*6/uL (ref 3.80–5.10)
RDW: 12.1 % (ref 11.0–15.0)
WBC: 6.2 10*3/uL (ref 3.8–10.8)

## 2017-05-16 LAB — COMPLETE METABOLIC PANEL WITH GFR
AG Ratio: 1.9 (calc) (ref 1.0–2.5)
ALBUMIN MSPROF: 4.3 g/dL (ref 3.6–5.1)
ALKALINE PHOSPHATASE (APISO): 67 U/L (ref 33–115)
ALT: 17 U/L (ref 6–29)
AST: 14 U/L (ref 10–30)
BUN: 11 mg/dL (ref 7–25)
CHLORIDE: 107 mmol/L (ref 98–110)
CO2: 27 mmol/L (ref 20–32)
Calcium: 9.7 mg/dL (ref 8.6–10.2)
Creat: 0.57 mg/dL (ref 0.50–1.10)
GFR, Est African American: 153 mL/min/{1.73_m2} (ref >=60)
GFR, Est Non African American: 132 mL/min/{1.73_m2} (ref >=60)
GLOBULIN: 2.3 g/dL (ref 1.9–3.7)
GLUCOSE: 91 mg/dL (ref 65–99)
Potassium: 4.3 mmol/L (ref 3.5–5.3)
SODIUM: 139 mmol/L (ref 135–146)
Total Bilirubin: 0.5 mg/dL (ref 0.2–1.2)
Total Protein: 6.6 g/dL (ref 6.1–8.1)

## 2017-05-16 LAB — HEPATITIS C ANTIBODY
HEP C AB: NONREACTIVE
SIGNAL TO CUT-OFF: 0.01 (ref <1.00)

## 2017-05-16 LAB — HIV ANTIBODY (ROUTINE TESTING W REFLEX): HIV: NONREACTIVE

## 2017-05-16 LAB — RPR: RPR Ser Ql: NONREACTIVE

## 2017-05-16 LAB — HEPATITIS B CORE ANTIBODY, TOTAL: Hep B Core Total Ab: NONREACTIVE

## 2017-05-16 LAB — HEPATITIS B SURFACE ANTIGEN: Hepatitis B Surface Ag: NONREACTIVE

## 2017-06-15 ENCOUNTER — Encounter: Payer: Self-pay | Admitting: Osteopathic Medicine

## 2017-06-15 ENCOUNTER — Ambulatory Visit (INDEPENDENT_AMBULATORY_CARE_PROVIDER_SITE_OTHER): Payer: 59 | Admitting: Osteopathic Medicine

## 2017-06-15 VITALS — BP 113/60 | HR 67 | Temp 98.5°F | Wt 138.7 lb

## 2017-06-15 DIAGNOSIS — N926 Irregular menstruation, unspecified: Secondary | ICD-10-CM | POA: Diagnosis not present

## 2017-06-15 DIAGNOSIS — Z3A01 Less than 8 weeks gestation of pregnancy: Secondary | ICD-10-CM

## 2017-06-15 LAB — POCT URINE PREGNANCY: Preg Test, Ur: POSITIVE — AB

## 2017-06-15 NOTE — Progress Notes (Signed)
HPI: Kristin Mcdaniel is a 22 y.o. female who  has no past medical history on file.  [name], 06/15/17,  for chief complaint of:  Missed period  LMP 05/02/2017 and periods are regular.  (+) home pregnancy test.  Started PNV already.  No bleeding/cramping or abnormal vaginal discharge.   Patient is accompanied by partner who assists with history-taking. Jola BabinskiVanicia Hernandez CMA also assists with translation and history-taking.    Past medical history, surgical history, social history and family history reviewed.   Current medication list and allergy/intolerance information reviewed.    No current outpatient medications on file prior to visit.   No current facility-administered medications on file prior to visit.    No Known Allergies    Review of Systems:  Constitutional: No recent illness or fever  Cardiac: No  chest pain, No  pressure, No palpitations  Respiratory:  No  shortness of breath.  Gastrointestinal: No  abdominal pain  Musculoskeletal: No new myalgia/arthralgia  Exam:  BP 113/60 (BP Location: Left Arm, Patient Position: Sitting, Cuff Size: Normal)   Pulse 67   Temp 98.5 F (36.9 C) (Oral)   Wt 138 lb 11.2 oz (62.9 kg)   LMP 05/02/2017   BMI 28.01 kg/m   Constitutional: VS see above. General Appearance: alert, well-developed, well-nourished, NAD  Eyes: Normal lids and conjunctive, non-icteric sclera  Ears, Nose, Mouth, Throat: MMM, Normal external inspection ears/nares/mouth/lips/gums.  Neck: No masses, trachea midline.   Respiratory: Normal respiratory effort.  Musculoskeletal: Gait normal. Symmetric and independent movement of all extremities  Neurological: Normal balance/coordination. No tremor.  Skin: warm, dry, intact.   Psychiatric: Normal judgment/insight. Normal mood and affect. Oriented x3.   Results for orders placed or performed in visit on 06/15/17 (from the past 24 hour(s))   POCT urine pregnancy     Status: Abnormal   Collection Time: 06/15/17  9:21 AM  Result Value Ref Range   Preg Test, Ur Positive (A) Negative     ASSESSMENT/PLAN:   Missed period - Plan: POCT urine pregnancy, Ambulatory referral to Obstetrics / Gynecology  Less than [redacted] weeks gestation of pregnancy - Plan: Ambulatory referral to Obstetrics / Gynecology      Follow-up plan: Return for if needed - especially if bleeding or abdominal pain in pregnancy.  Visit summary with medication list and pertinent instructions was printed for patient to review, alert us if any changes needed. All questions at time of visit were answered - patient instructed to contact office with any additional concerns. ER/RTC precautions were reviewed with the patient and understanding verbalized.   Note: Total time spent 15 minutes, greater than 50% of the visit was spent face-to-face counseling and coordinating care for the following: The primary encounter diagnosis was Missed period. A diagnosis of Less than [redacted] weeks gestation of pregnancy was also pertinent to this visit.Marland Kitchen.  Please note: voice recognition software was used to produce this document, and typos may escape review. Please contact Dr. Lyn HollingsheadAlexander for any needed clarifications.

## 2017-07-13 ENCOUNTER — Encounter: Payer: Self-pay | Admitting: Obstetrics & Gynecology

## 2017-07-13 ENCOUNTER — Ambulatory Visit (INDEPENDENT_AMBULATORY_CARE_PROVIDER_SITE_OTHER): Payer: 59 | Admitting: Obstetrics & Gynecology

## 2017-07-13 ENCOUNTER — Ambulatory Visit (INDEPENDENT_AMBULATORY_CARE_PROVIDER_SITE_OTHER): Payer: 59

## 2017-07-13 VITALS — BP 114/64 | HR 64 | Wt 139.0 lb

## 2017-07-13 DIAGNOSIS — Z3400 Encounter for supervision of normal first pregnancy, unspecified trimester: Secondary | ICD-10-CM | POA: Insufficient documentation

## 2017-07-13 DIAGNOSIS — Z113 Encounter for screening for infections with a predominantly sexual mode of transmission: Secondary | ICD-10-CM

## 2017-07-13 DIAGNOSIS — Z3481 Encounter for supervision of other normal pregnancy, first trimester: Secondary | ICD-10-CM | POA: Diagnosis not present

## 2017-07-13 DIAGNOSIS — Z3A10 10 weeks gestation of pregnancy: Secondary | ICD-10-CM

## 2017-07-13 MED ORDER — DOXYLAMINE-PYRIDOXINE 10-10 MG PO TBEC: 2.0000 | DELAYED_RELEASE_TABLET | Freq: Every day | ORAL | 2 refills | Status: DC

## 2017-07-13 NOTE — Progress Notes (Signed)
  Subjective:    Kristin Mcdaniel is a G1P0 [redacted]w[redacted]d being seen today for her first obstetrical visit.  Her obstetrical history is significant for unintended pregnancy, ESOL. Patient does intend to breast feed. Pregnancy history fully reviewed.  Patient reports nausea.  Vitals:   07/13/17 1034  BP: 114/64  Pulse: 64  Weight: 139 lb (63 kg)    HISTORY: OB History  Gravida Para Term Preterm AB Living  1            SAB TAB Ectopic Multiple Live Births               # Outcome Date GA Lbr Len/2nd Weight Sex Delivery Anes PTL Lv  1 Current            History reviewed. No pertinent past medical history. History reviewed. No pertinent surgical history. Family History  Problem Relation Age of Onset  . Diabetes Mother   . Hyperlipidemia Mother   . Diabetes Maternal Grandmother   . Hyperlipidemia Maternal Grandmother      Exam    Uterus:   10 weeks by US  Pelvic Exam:    Perineum: Not examined   Vulva: Not examined   Vagina:  Not examined   PH: Not examined   Cervix: Not examined   Adnexa: Not examined   Bony Pelvis: Not examined  System: Breast:  nml   Skin: normal coloration and turgor, no rashes    Neurologic: oriented, normal mood   Extremities: normal strength, tone, and muscle mass, no deformities   HEENT sclera clear, anicteric, oropharynx clear, no lesions, neck supple with midline trachea, thyroid without masses and trachea midline   Mouth/Teeth mucous membranes moist, pharynx normal without lesions and dental hygiene good   Neck supple and no masses   Cardiovascular: regular rate and rhythm   Respiratory:  appears well, vitals normal, no respiratory distress, acyanotic, normal RR, chest clear, no wheezing, crepitations, rhonchi, normal symmetric air entry   Abdomen: soft, non-tender; bowel sounds normal; no masses,  no organomegaly   Urinary: Not examined      Assessment:    Pregnancy: G1P0 Patient Active Problem List   Diagnosis Date Noted  . Supervision of  normal first pregnancy, antepartum 07/13/2017    Priority: High  . Benign heart murmur 05/10/2016        Plan:   Initial labs drawn.  Prenatal vitamins.  Problem list reviewed and updated.  Genetic Screening discussed; Will order Panorama and AFP.  Will get Panorama next visit.    Ultrasound discussed; fetal survey: Ordered for 19 weeks.  Prenatal yoga for lower back pain.  Diclegis for nausea  Follow up in 4 weeks.   Elsie Lincoln 07/13/2017

## 2017-07-13 NOTE — Progress Notes (Signed)
Last pap 05/13/17- Normal.

## 2017-07-13 NOTE — Patient Instructions (Signed)
First Trimester of Pregnancy The first trimester of pregnancy is from week 1 until the end of week 13 (months 1 through 3). A week after a sperm fertilizes an egg, the egg will implant on the wall of the uterus. This embryo will begin to develop into a baby. Genes from you and your partner will form the baby. The female genes will determine whether the baby will be a boy or a girl. At 6-8 weeks, the eyes and face will be formed, and the heartbeat can be seen on ultrasound. At the end of 12 weeks, all the baby's organs will be formed. Now that you are pregnant, you will want to do everything you can to have a healthy baby. Two of the most important things are to get good prenatal care and to follow your health care provider's instructions. Prenatal care is all the medical care you receive before the baby's birth. This care will help prevent, find, and treat any problems during the pregnancy and childbirth. Body changes during your first trimester Your body goes through many changes during pregnancy. The changes vary from woman to woman.  You may gain or lose a couple of pounds at first.  You may feel sick to your stomach (nauseous) and you may throw up (vomit). If the vomiting is uncontrollable, call your health care provider.  You may tire easily.  You may develop headaches that can be relieved by medicines. All medicines should be approved by your health care provider.  You may urinate more often. Painful urination may mean you have a bladder infection.  You may develop heartburn as a result of your pregnancy.  You may develop constipation because certain hormones are causing the muscles that push stool through your intestines to slow down.  You may develop hemorrhoids or swollen veins (varicose veins).  Your breasts may begin to grow larger and become tender. Your nipples may stick out more, and the tissue that surrounds them (areola) may become darker.  Your gums may bleed and may be  sensitive to brushing and flossing.  Dark spots or blotches (chloasma, mask of pregnancy) may develop on your face. This will likely fade after the baby is born.  Your menstrual periods will stop.  You may have a loss of appetite.  You may develop cravings for certain kinds of food.  You may have changes in your emotions from day to day, such as being excited to be pregnant or being concerned that something may go wrong with the pregnancy and baby.  You may have more vivid and strange dreams.  You may have changes in your hair. These can include thickening of your hair, rapid growth, and changes in texture. Some women also have hair loss during or after pregnancy, or hair that feels dry or thin. Your hair will most likely return to normal after your baby is born.  What to expect at prenatal visits During a routine prenatal visit:  You will be weighed to make sure you and the baby are growing normally.  Your blood pressure will be taken.  Your abdomen will be measured to track your baby's growth.  The fetal heartbeat will be listened to between weeks 10 and 14 of your pregnancy.  Test results from any previous visits will be discussed.  Your health care provider may ask you:  How you are feeling.  If you are feeling the baby move.  If you have had any abnormal symptoms, such as leaking fluid, bleeding, severe headaches,   or abdominal cramping.  If you are using any tobacco products, including cigarettes, chewing tobacco, and electronic cigarettes.  If you have any questions.  Other tests that may be performed during your first trimester include:  Blood tests to find your blood type and to check for the presence of any previous infections. The tests will also be used to check for low iron levels (anemia) and protein on red blood cells (Rh antibodies). Depending on your risk factors, or if you previously had diabetes during pregnancy, you may have tests to check for high blood  sugar that affects pregnant women (gestational diabetes).  Urine tests to check for infections, diabetes, or protein in the urine.  An ultrasound to confirm the proper growth and development of the baby.  Fetal screens for spinal cord problems (spina bifida) and Down syndrome.  HIV (human immunodeficiency virus) testing. Routine prenatal testing includes screening for HIV, unless you choose not to have this test.  You may need other tests to make sure you and the baby are doing well.  Follow these instructions at home: Medicines  Follow your health care provider's instructions regarding medicine use. Specific medicines may be either safe or unsafe to take during pregnancy.  Take a prenatal vitamin that contains at least 600 micrograms (mcg) of folic acid.  If you develop constipation, try taking a stool softener if your health care provider approves. Eating and drinking  Eat a balanced diet that includes fresh fruits and vegetables, whole grains, good sources of protein such as meat, eggs, or tofu, and low-fat dairy. Your health care provider will help you determine the amount of weight gain that is right for you.  Avoid raw meat and uncooked cheese. These carry germs that can cause birth defects in the baby.  Eating four or five small meals rather than three large meals a day may help relieve nausea and vomiting. If you start to feel nauseous, eating a few soda crackers can be helpful. Drinking liquids between meals, instead of during meals, also seems to help ease nausea and vomiting.  Limit foods that are high in fat and processed sugars, such as fried and sweet foods.  To prevent constipation: ? Eat foods that are high in fiber, such as fresh fruits and vegetables, whole grains, and beans. ? Drink enough fluid to keep your urine clear or pale yellow. Activity  Exercise only as directed by your health care provider. Most women can continue their usual exercise routine during  pregnancy. Try to exercise for 30 minutes at least 5 days a week. Exercising will help you: ? Control your weight. ? Stay in shape. ? Be prepared for labor and delivery.  Experiencing pain or cramping in the lower abdomen or lower back is a good sign that you should stop exercising. Check with your health care provider before continuing with normal exercises.  Try to avoid standing for long periods of time. Move your legs often if you must stand in one place for a long time.  Avoid heavy lifting.  Wear low-heeled shoes and practice good posture.  You may continue to have sex unless your health care provider tells you not to. Relieving pain and discomfort  Wear a good support bra to relieve breast tenderness.  Take warm sitz baths to soothe any pain or discomfort caused by hemorrhoids. Use hemorrhoid cream if your health care provider approves.  Rest with your legs elevated if you have leg cramps or low back pain.  If you develop   varicose veins in your legs, wear support hose. Elevate your feet for 15 minutes, 3-4 times a day. Limit salt in your diet. Prenatal care  Schedule your prenatal visits by the twelfth week of pregnancy. They are usually scheduled monthly at first, then more often in the last 2 months before delivery.  Write down your questions. Take them to your prenatal visits.  Keep all your prenatal visits as told by your health care provider. This is important. Safety  Wear your seat belt at all times when driving.  Make a list of emergency phone numbers, including numbers for family, friends, the hospital, and police and fire departments. General instructions  Ask your health care provider for a referral to a local prenatal education class. Begin classes no later than the beginning of month 6 of your pregnancy.  Ask for help if you have counseling or nutritional needs during pregnancy. Your health care provider can offer advice or refer you to specialists for help  with various needs.  Do not use hot tubs, steam rooms, or saunas.  Do not douche or use tampons or scented sanitary pads.  Do not cross your legs for long periods of time.  Avoid cat litter boxes and soil used by cats. These carry germs that can cause birth defects in the baby and possibly loss of the fetus by miscarriage or stillbirth.  Avoid all smoking, herbs, alcohol, and medicines not prescribed by your health care provider. Chemicals in these products affect the formation and growth of the baby.  Do not use any products that contain nicotine or tobacco, such as cigarettes and e-cigarettes. If you need help quitting, ask your health care provider. You may receive counseling support and other resources to help you quit.  Schedule a dentist appointment. At home, brush your teeth with a soft toothbrush and be gentle when you floss. Contact a health care provider if:  You have dizziness.  You have mild pelvic cramps, pelvic pressure, or nagging pain in the abdominal area.  You have persistent nausea, vomiting, or diarrhea.  You have a bad smelling vaginal discharge.  You have pain when you urinate.  You notice increased swelling in your face, hands, legs, or ankles.  You are exposed to fifth disease or chickenpox.  You are exposed to German measles (rubella) and have never had it. Get help right away if:  You have a fever.  You are leaking fluid from your vagina.  You have spotting or bleeding from your vagina.  You have severe abdominal cramping or pain.  You have rapid weight gain or loss.  You vomit blood or material that looks like coffee grounds.  You develop a severe headache.  You have shortness of breath.  You have any kind of trauma, such as from a fall or a car accident. Summary  The first trimester of pregnancy is from week 1 until the end of week 13 (months 1 through 3).  Your body goes through many changes during pregnancy. The changes vary from  woman to woman.  You will have routine prenatal visits. During those visits, your health care provider will examine you, discuss any test results you may have, and talk with you about how you are feeling. This information is not intended to replace advice given to you by your health care provider. Make sure you discuss any questions you have with your health care provider. Document Released: 02/09/2001 Document Revised: 01/28/2016 Document Reviewed: 01/28/2016 Elsevier Interactive Patient Education  2018 [employer].  

## 2017-07-14 LAB — OBSTETRIC PANEL
Antibody Screen: NOT DETECTED
BASOS ABS: 43 {cells}/uL (ref 0–200)
Basophils Relative: 0.6 %
EOS ABS: 78 {cells}/uL (ref 15–500)
EOS PCT: 1.1 %
HCT: 32.3 % — ABNORMAL LOW (ref 35.0–45.0)
HEP B S AG: NONREACTIVE
Hemoglobin: 11.2 g/dL — ABNORMAL LOW (ref 11.7–15.5)
LYMPHS ABS: 1846 {cells}/uL (ref 850–3900)
MCH: 30.3 pg (ref 27.0–33.0)
MCHC: 34.7 g/dL (ref 32.0–36.0)
MCV: 87.3 fL (ref 80.0–100.0)
MPV: 12.2 fL (ref 7.5–12.5)
Monocytes Relative: 6.9 %
NEUTROS PCT: 65.4 %
Neutro Abs: 4643 cells/uL (ref 1500–7800)
PLATELETS: 223 10*3/uL (ref 140–400)
RBC: 3.7 10*6/uL — ABNORMAL LOW (ref 3.80–5.10)
RDW: 12.4 % (ref 11.0–15.0)
RPR: NONREACTIVE
RUBELLA: 7.28 {index}
Total Lymphocyte: 26 %
WBC mixed population: 490 cells/uL (ref 200–950)
WBC: 7.1 10*3/uL (ref 3.8–10.8)

## 2017-07-14 LAB — GC/CHLAMYDIA PROBE AMP (~~LOC~~) NOT AT ARMC
CHLAMYDIA, DNA PROBE: NEGATIVE
Neisseria Gonorrhea: NEGATIVE

## 2017-07-14 LAB — HIV ANTIBODY (ROUTINE TESTING W REFLEX): HIV: NONREACTIVE

## 2017-07-16 LAB — CULTURE, OB URINE

## 2017-07-16 LAB — URINE CULTURE, OB REFLEX

## 2017-08-10 ENCOUNTER — Ambulatory Visit (INDEPENDENT_AMBULATORY_CARE_PROVIDER_SITE_OTHER): Payer: 59 | Admitting: Obstetrics & Gynecology

## 2017-08-10 DIAGNOSIS — Z3400 Encounter for supervision of normal first pregnancy, unspecified trimester: Secondary | ICD-10-CM

## 2017-08-10 DIAGNOSIS — Z3402 Encounter for supervision of normal first pregnancy, second trimester: Secondary | ICD-10-CM

## 2017-08-10 NOTE — Progress Notes (Signed)
   PRENATAL VISIT NOTE  Subjective:  Kristin Mcdaniel is a 22 y.o. G1P0 at 4933w2d being seen today for ongoing prenatal care.  She is currently monitored for the following issues for this low-risk pregnancy and has Benign heart murmur and Supervision of normal first pregnancy, antepartum on their problem list.  Patient reports no complaints.  Contractions: Not present. Vag. Bleeding: None.  Movement: Absent. Denies leaking of fluid.   The following portions of the patient's history were reviewed and updated as appropriate: allergies, current medications, past family history, past medical history, past social history, past surgical history and problem list. Problem list updated.  Objective:   Vitals:   08/10/17 0839  BP: 102/60  Pulse: 68  Weight: 141 lb (64 kg)    Fetal Status: Fetal Heart Rate (bpm): 147   Movement: Absent     General:  Alert, oriented and cooperative. Patient is in no acute distress.  Skin: Skin is warm and dry. No rash noted.   Cardiovascular: Normal heart rate noted  Respiratory: Normal respiratory effort, no problems with respiration noted  Abdomen: Soft, gravid, appropriate for gestational age.  Pain/Pressure: Absent     Pelvic: Cervical exam deferred        Extremities: Normal range of motion.  Edema: None  Mental Status: Normal mood and affect. Normal behavior. Normal judgment and thought content.   Assessment and Plan:  Pregnancy: G1P0 at 6233w2d  1. Supervision of normal first pregnancy, antepartum  - Genetic Screening  Preterm labor symptoms and general obstetric precautions including but not limited to vaginal bleeding, contractions, leaking of fluid and fetal movement were reviewed in detail with the patient. Please refer to After Visit Summary for other counseling recommendations.  No follow-ups on file.  Future Appointments  Date Time Provider Department Center  09/12/2017  3:30 PM [facility] US 1 [facility]  05/15/2018  9:50 AM Sunnie NielsenAlexander, Natalie,  DO [facility] None    Allie BossierMyra C Stephanie Mcglone, MD

## 2017-08-10 NOTE — Progress Notes (Signed)
Genetic labs drawn

## 2017-08-11 LAB — HEMOGLOBIN A1C
EAG (MMOL/L): 5.4 (calc)
HEMOGLOBIN A1C: 5 %{Hb} (ref <5.7)
Mean Plasma Glucose: 97 (calc)

## 2017-08-11 LAB — SICKLE CELL SCREEN: Sickle Solubility Test - HGBRFX: NEGATIVE

## 2017-08-17 ENCOUNTER — Encounter: Payer: Self-pay | Admitting: *Deleted

## 2017-08-17 DIAGNOSIS — Z3400 Encounter for supervision of normal first pregnancy, unspecified trimester: Secondary | ICD-10-CM

## 2017-09-07 ENCOUNTER — Ambulatory Visit (INDEPENDENT_AMBULATORY_CARE_PROVIDER_SITE_OTHER): Payer: 59 | Admitting: Obstetrics & Gynecology

## 2017-09-07 ENCOUNTER — Encounter (HOSPITAL_COMMUNITY): Payer: Self-pay

## 2017-09-07 VITALS — BP 104/63 | HR 82 | Wt 144.0 lb

## 2017-09-07 DIAGNOSIS — Z3402 Encounter for supervision of normal first pregnancy, second trimester: Secondary | ICD-10-CM | POA: Diagnosis not present

## 2017-09-07 DIAGNOSIS — Z3400 Encounter for supervision of normal first pregnancy, unspecified trimester: Secondary | ICD-10-CM

## 2017-09-07 MED ORDER — POLYETHYLENE GLYCOL 3350 17 GM/SCOOP PO POWD: 17.0000 g | Freq: Every day | ORAL | 0 refills | Status: DC

## 2017-09-07 MED ORDER — DOCUSATE SODIUM 100 MG PO CAPS: 100.0000 mg | ORAL_CAPSULE | Freq: Two times a day (BID) | ORAL | 2 refills | Status: DC | PRN

## 2017-09-07 NOTE — Progress Notes (Signed)
AFP will be drawn today    PRENATAL VISIT NOTE  Subjective:  Kristin Mcdaniel is a 22 y.o. G1P0 at 3776w2d being seen today for ongoing prenatal care.  She is currently monitored for the following issues for this low-risk pregnancy and has Benign heart murmur and Supervision of normal first pregnancy, antepartum on their problem list.  Patient reports burning cessation on left side of abdomen--at level of round ligma.  Contractions: Not present. Vag. Bleeding: None.  Movement: Present. Denies leaking of fluid. Pt has constipation (long standing).  Has BM every 2 days.  It is hard and she needs to strain.  Not taking any meds currently.    The following portions of the patient's history were reviewed and updated as appropriate: allergies, current medications, past family history, past medical history, past social history, past surgical history and problem list. Problem list updated.  Objective:   Vitals:   09/07/17 0820  BP: 104/63  Pulse: 82  Weight: 144 lb (65.3 kg)    Fetal Status: Fetal Heart Rate (bpm): 156   Movement: Present     General:  Alert, oriented and cooperative. Patient is in no acute distress.  Skin: Skin is warm and dry. No rash noted.   Cardiovascular: Normal heart rate noted  Respiratory: Normal respiratory effort, no problems with respiration noted  Abdomen: Soft, gravid, appropriate for gestational age.  Pain/Pressure: Present     Pelvic: Cervical exam deferred        Extremities: Normal range of motion.  Edema: None  Mental Status: Normal mood and affect. Normal behavior. Normal judgment and thought content.   Assessment and Plan:  Pregnancy: G1P0 at 4876w2d  1. Supervision of normal first pregnancy, antepartum - Anatomy US next week - Alpha fetoprotein, maternal Marian Sorrow- Olga [id number] interpreter used today.      2.  Constipation - Colcae bid - prn Miralax - increase fluids and fiber  Preterm labor symptoms and general obstetric precautions including but not  limited to vaginal bleeding, contractions, leaking of fluid and fetal movement were reviewed in detail with the patient. Please refer to After Visit Summary for other counseling recommendations.  'RTC 4 weeks  Future Appointments  Date Time Provider Department Center  09/12/2017  3:30 PM [facility] US 1 [facility]  05/15/2018  9:50 AM Sunnie NielsenAlexander, Natalie, DO [facility] None    Elsie LincolnKelly Dia Jefferys, MD

## 2017-09-07 NOTE — Patient Instructions (Signed)
Estreimiento en los adultos Constipation, Adult Se llama estreimiento cuando:  Tiene deposiciones (defeca) una menor cantidad de veces a la semana de lo normal.  Tiene dificultad para defecar.  Las heces son secas y duras o son ms grandes que lo normal.  Siga estas indicaciones en su casa: Comida y bebida   Consuma alimentos con alto contenido de fibra, por ejemplo: ? Frutas y verduras frescas. ? Cereales integrales. ? Frijoles.  Consuma una menor cantidad de alimentos ricos en grasas, con bajo contenido de fibra o excesivamente procesados, como: ? Papas fritas. ? Hamburguesas. ? Galletas. ? Caramelos. ? Gaseosas.  Beba suficiente lquido para mantener el pis (orina) claro o de color amarillo plido. Instrucciones generales  Haga actividad fsica con regularidad o segn las indicaciones del mdico.  Vaya al bao cuando sienta la necesidad de defecar. No se aguante las ganas.  Tome los medicamentos de venta libre y los recetados solamente como se lo haya indicado el mdico. Estos incluyen los suplementos de fibra.  Realice ejercicios de reentrenamiento del suelo plvico, como: ? Respirar profundamente mientras relaja la parte inferior del vientre (abdomen). ? Relajar el suelo plvico mientras defeca.  Controle su afeccin para ver si hay cambios.  Concurra a todas las visitas de control como se lo haya indicado el mdico. Esto es importante. Comunquese con un mdico si:  Siente un dolor que empeora.  Tiene fiebre.  No ha defecado por 4das.  Vomita.  No tiene hambre.  Pierde peso.  Tiene una hemorragia en el ano.  Las deposiciones (heces) son delgadas como un lpiz. Solicite ayuda de inmediato si:  Tiene fiebre, y los sntomas empeoran de repente.  Tiene prdida de materia fecal u observa sangre en las heces.  Siente el vientre ms duro o ms grande de lo normal (est hinchado).  Siente un dolor muy intenso en el vientre.  Se siente mareado o  se desmaya. Esta informacin no tiene como fin reemplazar el consejo del mdico. Asegrese de hacerle al mdico cualquier pregunta que tenga. Document Released: 03/20/2010 Document Revised: 05/19/2016 Document Reviewed: 08/06/2015 Elsevier Interactive Patient Education  2018 Elsevier Inc[employer]  

## 2017-09-08 LAB — ALPHA FETOPROTEIN, MATERNAL
AFP MoM: 0.79
AFP, Serum: 35.1 ng/mL
CALC'D GESTATIONAL AGE: 18.3 wk
MATERNAL WT: 144 [lb_av]
TWINS-AFP: 1

## 2017-09-12 ENCOUNTER — Other Ambulatory Visit: Payer: Self-pay | Admitting: Obstetrics & Gynecology

## 2017-09-12 ENCOUNTER — Ambulatory Visit (HOSPITAL_COMMUNITY)
Admission: RE | Admit: 2017-09-12 | Discharge: 2017-09-12 | Disposition: A | Discharge status: Home / Self Care | Payer: 59 | Source: Ambulatory Visit | Attending: Obstetrics & Gynecology | Admitting: Obstetrics & Gynecology

## 2017-09-12 DIAGNOSIS — Z363 Encounter for antenatal screening for malformations: Secondary | ICD-10-CM | POA: Diagnosis present

## 2017-09-12 DIAGNOSIS — Z3A19 19 weeks gestation of pregnancy: Secondary | ICD-10-CM

## 2017-09-12 DIAGNOSIS — Z3481 Encounter for supervision of other normal pregnancy, first trimester: Secondary | ICD-10-CM

## 2017-09-14 ENCOUNTER — Other Ambulatory Visit (HOSPITAL_COMMUNITY): Payer: Self-pay | Admitting: *Deleted

## 2017-09-14 DIAGNOSIS — Z362 Encounter for other antenatal screening follow-up: Secondary | ICD-10-CM

## 2017-10-05 ENCOUNTER — Ambulatory Visit (INDEPENDENT_AMBULATORY_CARE_PROVIDER_SITE_OTHER): Payer: 59 | Admitting: Obstetrics & Gynecology

## 2017-10-05 DIAGNOSIS — Z3400 Encounter for supervision of normal first pregnancy, unspecified trimester: Secondary | ICD-10-CM | POA: Diagnosis not present

## 2017-10-05 NOTE — Progress Notes (Signed)
   PRENATAL VISIT NOTE  Subjective:  Kristin Mcdaniel is a 22 y.o. G1P0 at 139w2d being seen today for ongoing prenatal care.  She is currently monitored for the following issues for this low-risk pregnancy and has Benign heart murmur and Supervision of normal first pregnancy, antepartum on their problem list.  Patient reports carpel tunnel smptoms, worse at night.  Contractions: Not present. Vag. Bleeding: None.  Movement: Present. Denies leaking of fluid.   The following portions of the patient's history were reviewed and updated as appropriate: allergies, current medications, past family history, past medical history, past social history, past surgical history and problem list. Problem list updated.  Objective:   Vitals:   10/05/17 0819  BP: (!) 106/59  Pulse: 78  Weight: 153 lb (69.4 kg)    Fetal Status: Fetal Heart Rate (bpm): 158   Movement: Present     General:  Alert, oriented and cooperative. Patient is in no acute distress.  Skin: Skin is warm and dry. No rash noted.   Cardiovascular: Normal heart rate noted  Respiratory: Normal respiratory effort, no problems with respiration noted  Abdomen: Soft, gravid, appropriate for gestational age.  Pain/Pressure: Absent     Pelvic: Cervical exam deferred        Extremities: Normal range of motion.  Edema: None  Mental Status: Normal mood and affect. Normal behavior. Normal judgment and thought content.   Assessment and Plan:  Pregnancy: G1P0 at 689w2d  1. 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  Pt requesting note to be on machine 1/2 day and have other job duties the other 1/2 to help relieve back pain.  Preterm labor symptoms and general obstetric precautions including but not limited to vaginal bleeding, contractions, leaking of fluid and  fetal movement were reviewed in detail with the patient. Please refer to After Visit Summary for other counseling recommendations.  Return in about 1 month (around 11/02/2017).  Future Appointments  Date Time Provider Department Center  10/10/2017  2:45 PM [facility] US 2 [facility]  05/15/2018  9:50 AM Sunnie NielsenAlexander, Natalie, DO [facility] None    Elsie LincolnKelly Esaw Knippel, MD

## 2017-10-06 LAB — ALPHA FETOPROTEIN, MATERNAL
AFP MoM: 0.56
AFP, Serum: 43.5 ng/mL
Calc'd Gestational Age: 22.3 weeks
Maternal Wt: 153 [lb_av]
Twins-AFP: 1

## 2017-10-10 ENCOUNTER — Ambulatory Visit (HOSPITAL_COMMUNITY)
Admission: RE | Admit: 2017-10-10 | Discharge: 2017-10-10 | Disposition: A | Discharge status: Home / Self Care | Payer: 59 | Source: Ambulatory Visit | Attending: Obstetrics & Gynecology | Admitting: Obstetrics & Gynecology

## 2017-10-10 ENCOUNTER — Other Ambulatory Visit (HOSPITAL_COMMUNITY): Payer: Self-pay | Admitting: Obstetrics and Gynecology

## 2017-10-10 DIAGNOSIS — Z362 Encounter for other antenatal screening follow-up: Secondary | ICD-10-CM | POA: Diagnosis present

## 2017-10-10 DIAGNOSIS — Z3A23 23 weeks gestation of pregnancy: Secondary | ICD-10-CM | POA: Diagnosis not present

## 2017-10-19 ENCOUNTER — Ambulatory Visit (INDEPENDENT_AMBULATORY_CARE_PROVIDER_SITE_OTHER): Payer: 59 | Admitting: Obstetrics & Gynecology

## 2017-10-19 ENCOUNTER — Other Ambulatory Visit: Payer: 59

## 2017-10-19 DIAGNOSIS — O368120 Decreased fetal movements, second trimester, not applicable or unspecified: Secondary | ICD-10-CM

## 2017-10-19 NOTE — Progress Notes (Signed)
Pt called stating that she is pregnant @ 24 weeks and has felt decreased fetal movement.  I explained that the movements are as strong yet and baby does take sleeping times.  Bedside U/S shows a viable fetus that is moving and kicking with FHT of 152 BPM.  Pt reassured.  Article given to pt and spouse regarding fetal movements and encouraged them to sign up for their BRX app.  Lots of good information on app that might help to alleviate some anxiety.  As always encouraged pts to call if they have unusual  symptoms or concerns.

## 2017-10-20 NOTE — Progress Notes (Signed)
I have reviewed this chart and agree with the RN/CMA assessment and management.    Analisa Sledd C Beyza Bellino, MD, FACOG Attending Physician, [facility]  

## 2017-11-02 ENCOUNTER — Ambulatory Visit (INDEPENDENT_AMBULATORY_CARE_PROVIDER_SITE_OTHER): Payer: 59 | Admitting: Obstetrics & Gynecology

## 2017-11-02 ENCOUNTER — Other Ambulatory Visit (HOSPITAL_COMMUNITY): Payer: Self-pay | Admitting: Obstetrics & Gynecology

## 2017-11-02 VITALS — BP 113/68 | HR 88 | Wt 163.0 lb

## 2017-11-02 DIAGNOSIS — N898 Other specified noninflammatory disorders of vagina: Secondary | ICD-10-CM

## 2017-11-02 DIAGNOSIS — Z23 Encounter for immunization: Secondary | ICD-10-CM | POA: Diagnosis not present

## 2017-11-02 DIAGNOSIS — Z113 Encounter for screening for infections with a predominantly sexual mode of transmission: Secondary | ICD-10-CM | POA: Diagnosis not present

## 2017-11-02 DIAGNOSIS — Z3400 Encounter for supervision of normal first pregnancy, unspecified trimester: Secondary | ICD-10-CM

## 2017-11-02 DIAGNOSIS — Z3402 Encounter for supervision of normal first pregnancy, second trimester: Secondary | ICD-10-CM

## 2017-11-02 NOTE — Progress Notes (Addendum)
   PRENATAL VISIT NOTE  Subjective:  Kristin Mcdaniel is a 22 y.o. G1P0 at [redacted]w[redacted]d being seen today for ongoing prenatal care.  She is currently monitored for the following issues for this low-risk pregnancy and has Benign heart murmur and Supervision of normal first pregnancy, antepartum on their problem list.  Patient reports a vaginal odor for about 3 weeks. She has been using Vagisil.  Contractions: Not present. Vag. Bleeding: None.  Movement: Present. Denies leaking of fluid.   The following portions of the patient's history were reviewed and updated as appropriate: allergies, current medications, past family history, past medical history, past social history, past surgical history and problem list. Problem list updated.  Objective:   Vitals:   11/02/17 0726  BP: 113/68  Pulse: 88  Weight: 163 lb (73.9 kg)    Fetal Status:     Movement: Present     General:  Alert, oriented and cooperative. Patient is in no acute distress.  Skin: Skin is warm and dry. No rash noted.   Cardiovascular: Normal heart rate noted  Respiratory: Normal respiratory effort, no problems with respiration noted  Abdomen: Soft, gravid, appropriate for gestational age.  Pain/Pressure: Absent     Pelvic: Cervical exam deferred      , speculum exam reveals some yellowish vaginal discharge  Extremities: Normal range of motion.  Edema: None  Mental Status: Normal mood and affect. Normal behavior. Normal judgment and thought content.   Assessment and Plan:  Pregnancy: G1P0 at [redacted]w[redacted]d  1. Supervision of normal first pregnancy, antepartum - TDAP - Flu vaccine - HIV antibody (with reflex) - CBC - RPR - 2Hr GTT w/ 1 Hr Carpenter 75 g  2. Vaginal discharge- I discouraged Vagisil use - wet prep sent  Preterm labor symptoms and general obstetric precautions including but not limited to vaginal bleeding, contractions, leaking of fluid and fetal movement were reviewed in detail with the patient. Please refer to After  Visit Summary for other counseling recommendations.  No follow-ups on file.  Future Appointments  Date Time Provider Department Center  05/15/2018  9:50 AM Sunnie Nielsen, DO [facility] None    Allie Bossier, MD

## 2017-11-02 NOTE — Addendum Note (Signed)
Addended by: Granville Lewis on: 11/02/2017 08:02 AM   Modules accepted: Orders

## 2017-11-03 LAB — HIV ANTIBODY (ROUTINE TESTING W REFLEX): HIV 1&2 Ab, 4th Generation: NONREACTIVE

## 2017-11-03 LAB — 2HR GTT W 1 HR, CARPENTER, 75 G
GLUCOSE, 2 HR, GEST: 108 mg/dL (ref 65–152)
Glucose, 1 Hr, Gest: 129 mg/dL (ref 65–179)
Glucose, Fasting, Gest: 82 mg/dL (ref 65–91)

## 2017-11-03 LAB — CBC
HCT: 31.6 % — ABNORMAL LOW (ref 35.0–45.0)
Hemoglobin: 10.8 g/dL — ABNORMAL LOW (ref 11.7–15.5)
MCH: 30.9 pg (ref 27.0–33.0)
MCHC: 34.2 g/dL (ref 32.0–36.0)
MCV: 90.5 fL (ref 80.0–100.0)
MPV: 11.4 fL (ref 7.5–12.5)
PLATELETS: 187 10*3/uL (ref 140–400)
RBC: 3.49 10*6/uL — ABNORMAL LOW (ref 3.80–5.10)
RDW: 12.8 % (ref 11.0–15.0)
WBC: 10.2 10*3/uL (ref 3.8–10.8)

## 2017-11-03 LAB — CERVICOVAGINAL ANCILLARY ONLY
Bacterial vaginitis: NEGATIVE
Candida vaginitis: POSITIVE — AB
Trichomonas: NEGATIVE

## 2017-11-03 LAB — RPR: RPR: NONREACTIVE

## 2017-11-04 ENCOUNTER — Other Ambulatory Visit: Payer: Self-pay | Admitting: Obstetrics & Gynecology

## 2017-11-04 MED ORDER — FLUCONAZOLE 150 MG PO TABS: 150.0000 mg | ORAL_TABLET | Freq: Once | ORAL | 3 refills | Status: AC

## 2017-11-04 NOTE — Progress Notes (Signed)
Diflucan prescribed

## 2017-11-08 ENCOUNTER — Telehealth: Payer: Self-pay | Admitting: *Deleted

## 2017-11-08 MED ORDER — FLUCONAZOLE 150 MG PO TABS: 150.0000 mg | ORAL_TABLET | Freq: Once | ORAL | 0 refills | Status: AC

## 2017-11-08 NOTE — Telephone Encounter (Signed)
-----   Message from Allie Bossier, MD sent at 11/04/2017  8:18 AM EDT ----- Please let her know that I prescribed diflucan as it was seen on her pap smear.

## 2017-11-08 NOTE — Telephone Encounter (Signed)
LM on voicemail that she has vaginal yeast and Dr Marice Potter has sent a RX for Diflucan into her pharmacy

## 2017-11-18 ENCOUNTER — Ambulatory Visit (INDEPENDENT_AMBULATORY_CARE_PROVIDER_SITE_OTHER): Payer: 59

## 2017-11-18 VITALS — BP 116/67 | HR 92 | Wt 162.0 lb

## 2017-11-18 DIAGNOSIS — Z3400 Encounter for supervision of normal first pregnancy, unspecified trimester: Secondary | ICD-10-CM

## 2017-11-18 NOTE — Patient Instructions (Signed)

## 2017-11-18 NOTE — Progress Notes (Signed)
   PRENATAL VISIT NOTE  Subjective:  Kristin Mcdaniel is a 22 y.o. G1P0 at 915w4d being seen today for ongoing prenatal care.  She is currently monitored for the following issues for this low-risk pregnancy and has Benign heart murmur and Supervision of normal first pregnancy, antepartum on their problem list.  Patient reports no complaints.  Contractions: Not present. Vag. Bleeding: None.  Movement: Present. Denies leaking of fluid.   The following portions of the patient's history were reviewed and updated as appropriate: allergies, current medications, past family history, past medical history, past social history, past surgical history and problem list. Problem list updated.  Objective:   Vitals:   11/18/17 0803  BP: 116/67  Pulse: 92  Weight: 162 lb (73.5 kg)    Fetal Status: Fetal Heart Rate (bpm): 147 Fundal Height: 29 cm Movement: Present     General:  Alert, oriented and cooperative. Patient is in no acute distress.  Skin: Skin is warm and dry. No rash noted.   Cardiovascular: Normal heart rate noted  Respiratory: Normal respiratory effort, no problems with respiration noted  Abdomen: Soft, gravid, appropriate for gestational age.  Pain/Pressure: Absent     Pelvic: Cervical exam deferred        Extremities: Normal range of motion.  Edema: None  Mental Status: Normal mood and affect. Normal behavior. Normal judgment and thought content.   Assessment and Plan:  Pregnancy: G1P0 at 1215w4d  1. Supervision of normal first pregnancy, antepartum - No complaints. Routine care  Preterm labor symptoms and general obstetric precautions including but not limited to vaginal bleeding, contractions, leaking of fluid and fetal movement were reviewed in detail with the patient. Please refer to After Visit Summary for other counseling recommendations.  Return in about 2 weeks (around 12/02/2017).  Future Appointments  Date Time Provider Department Center  05/15/2018  9:50 AM Sunnie NielsenAlexander,  Natalie, DO [facility] None    Rolm BookbinderCaroline M Neill, [place] 11/18/17 8:19 AM

## 2017-12-02 ENCOUNTER — Ambulatory Visit (INDEPENDENT_AMBULATORY_CARE_PROVIDER_SITE_OTHER): Payer: 59 | Admitting: Advanced Practice Midwife

## 2017-12-02 VITALS — BP 120/72 | HR 111 | Wt 167.0 lb

## 2017-12-02 DIAGNOSIS — Z3400 Encounter for supervision of normal first pregnancy, unspecified trimester: Secondary | ICD-10-CM

## 2017-12-02 DIAGNOSIS — Z3403 Encounter for supervision of normal first pregnancy, third trimester: Secondary | ICD-10-CM

## 2017-12-02 NOTE — Progress Notes (Signed)
   PRENATAL VISIT NOTE  Subjective:  Kristin Mcdaniel is a 22 y.o. G1P0 at [redacted]w[redacted]d being seen today for ongoing prenatal care.  She is currently monitored for the following issues for this low-risk pregnancy and has Benign heart murmur and Supervision of normal first pregnancy, antepartum on their problem list.  Patient reports occasional mild intermittent abdominal pain, not daily.  Contractions: Not present. Vag. Bleeding: None.  Movement: Present. Denies leaking of fluid.   The following portions of the patient's history were reviewed and updated as appropriate: allergies, current medications, past family history, past medical history, past social history, past surgical history and problem list. Problem list updated.  Objective:   Vitals:   12/02/17 0822  BP: 120/72  Pulse: (!) 111  Weight: 75.8 kg    Fetal Status: Fetal Heart Rate (bpm): 142   Movement: Present     General:  Alert, oriented and cooperative. Patient is in no acute distress.  Skin: Skin is warm and dry. No rash noted.   Cardiovascular: Normal heart rate noted  Respiratory: Normal respiratory effort, no problems with respiration noted  Abdomen: Soft, gravid, appropriate for gestational age.  Pain/Pressure: Absent     Pelvic: Cervical exam deferred        Extremities: Normal range of motion.  Edema: Trace  Mental Status: Normal mood and affect. Normal behavior. Normal judgment and thought content.   Assessment and Plan:  Pregnancy: G1P0 at [redacted]w[redacted]d  1. Supervision of normal first pregnancy, antepartum --Anticipatory guidance about next visits/weeks of pregnancy given. --Discussed normal discomforts of pregnancy, rest, increase PO fluids for mild irregular cramping, seek care if more frequent or more painful  Preterm labor symptoms and general obstetric precautions including but not limited to vaginal bleeding, contractions, leaking of fluid and fetal movement were reviewed in detail with the patient. Please refer to  After Visit Summary for other counseling recommendations.  Return in about 2 weeks (around 12/16/2017).  Future Appointments  Date Time Provider Department Center  05/15/2018  9:50 AM Sunnie Nielsen, DO [facility] None    Sharen Counter, CNM

## 2017-12-02 NOTE — Patient Instructions (Signed)
Tercer trimestre de embarazo (Third Trimester of Pregnancy) El tercer trimestre comprende desde la semana29 hasta la semana42, es decir, desde el mes7 hasta el mes9. El tercer trimestre es un perodo en el que el feto crece rpidamente. Hacia el final del noveno mes, el feto mide alrededor de 20pulgadas (45cm) de largo y pesa entre 6 y 10 libras (2,700 y 4,500kg). CAMBIOS EN EL ORGANISMO Su organismo atraviesa por muchos cambios durante el embarazo, y estos varan de una mujer a otra.  Seguir aumentando de peso. Es de esperar que aumente entre 25 y 35libras (11 y 16kg) hacia el final del embarazo.  Podrn aparecer las primeras estras en las caderas, el abdomen y las mamas.  Puede tener necesidad de orinar con ms frecuencia porque el feto baja hacia la pelvis y ejerce presin sobre la vejiga.  Debido al embarazo podr sentir acidez estomacal con frecuencia.  Puede estar estreida, ya que ciertas hormonas enlentecen los movimientos de los msculos que empujan los desechos a travs de los intestinos.  Pueden aparecer hemorroides o abultarse e hincharse las venas (venas varicosas).  Puede sentir dolor plvico debido al aumento de peso y a que las hormonas del embarazo relajan las articulaciones entre los huesos de la pelvis. El dolor de espalda puede ser consecuencia de la sobrecarga de los msculos que soportan la postura.  Tal vez haya cambios en el cabello que pueden incluir su engrosamiento, crecimiento rpido y cambios en la textura. Adems, a algunas mujeres se les cae el cabello durante o despus del embarazo, o tienen el cabello seco o fino. Lo ms probable es que el cabello se le normalice despus del nacimiento del beb.  Las mamas seguirn creciendo y le dolern. A veces, puede haber una secrecin amarilla de las mamas llamada calostro.  El ombligo puede salir hacia afuera.  Puede sentir que le falta el aire debido a que se expande el tero.  Puede notar que el feto  "baja" o lo siente ms bajo, en el abdomen.  Puede tener una prdida de secrecin mucosa con sangre. Esto suele ocurrir en el trmino de unos pocos das a una semana antes de que comience el trabajo de parto.  El cuello del tero se vuelve delgado y blando (se borra) cerca de la fecha de parto. QU DEBE ESPERAR EN LOS EXMENES PRENATALES Le harn exmenes prenatales cada 2semanas hasta la semana36. A partir de ese momento le harn exmenes semanales. Durante una visita prenatal de rutina:  La pesarn para asegurarse de que usted y el feto estn creciendo normalmente.  Le tomarn la presin arterial.  Le medirn el abdomen para controlar el desarrollo del beb.  Se escucharn los latidos cardacos fetales.  Se evaluarn los resultados de los estudios solicitados en visitas anteriores.  Le revisarn el cuello del tero cuando est prxima la fecha de parto para controlar si este se ha borrado. Alrededor de la semana36, el mdico le revisar el cuello del tero. Al mismo tiempo, realizar un anlisis de las secreciones del tejido vaginal. Este examen es para determinar si hay un tipo de bacteria, estreptococo Grupo B. El mdico le explicar esto con ms detalle. El mdico puede preguntarle lo siguiente:  Cmo le gustara que fuera el parto.  Cmo se siente.  Si siente los movimientos del beb.  Si ha tenido sntomas anormales, como prdida de lquido, sangrado, dolores de cabeza intensos o clicos abdominales.  Si est consumiendo algn producto que contenga tabaco, como cigarrillos, tabaco de mascar y   cigarrillos electrnicos.  Si tiene alguna pregunta. Otros exmenes o estudios de deteccin que pueden realizarse durante el tercer trimestre incluyen lo siguiente:  Anlisis de sangre para controlar los niveles de hierro (anemia).  Controles fetales para determinar su salud, nivel de actividad y crecimiento. Si tiene alguna enfermedad o hay problemas durante el embarazo, le harn  estudios.  Prueba del VIH (virus de inmunodeficiencia humana). Si corre un riesgo alto, pueden realizarle una prueba de deteccin del VIH durante el tercer trimestre del embarazo. FALSO TRABAJO DE PARTO Es posible que sienta contracciones leves e irregulares que finalmente desaparecen. Se llaman contracciones de Braxton Hicks o falso trabajo de parto. Las contracciones pueden durar horas, das o incluso semanas, antes de que el verdadero trabajo de parto se inicie. Si las contracciones ocurren a intervalos regulares, se intensifican o se hacen dolorosas, lo mejor es que la revise el mdico. SIGNOS DE TRABAJO DE PARTO  Clicos de tipo menstrual.  Contracciones cada 5minutos o menos.  Contracciones que comienzan en la parte superior del tero y se extienden hacia abajo, a la zona inferior del abdomen y la espalda.  Sensacin de mayor presin en la pelvis o dolor de espalda.  Una secrecin de mucosidad acuosa o con sangre que sale de la vagina. Si tiene alguno de estos signos antes de la semana37 del embarazo, llame a su mdico de inmediato. Debe concurrir al hospital para que la controlen inmediatamente. INSTRUCCIONES PARA EL CUIDADO EN EL HOGAR  Evite fumar, consumir hierbas, beber alcohol y tomar frmacos que no le hayan recetado. Estas sustancias qumicas afectan la formacin y el desarrollo del beb.  No consuma ningn producto que contenga tabaco, lo que incluye cigarrillos, tabaco de mascar y cigarrillos electrnicos. Si necesita ayuda para dejar de fumar, consulte al mdico. Puede recibir asesoramiento y otro tipo de recursos para dejar de fumar.  Siga las indicaciones del mdico en relacin con el uso de medicamentos. Durante el embarazo, hay medicamentos que son seguros de tomar y otros que no.  Haga ejercicio solamente como se lo haya indicado el mdico. Sentir clicos uterinos es un buen signo para detener la actividad fsica.  Contine comiendo alimentos sanos con  regularidad.  Use un sostn que le brinde buen soporte si le duelen las mamas.  No se d baos de inmersin en agua caliente, baos turcos ni saunas.  Use el cinturn de seguridad en todo momento mientras conduce.  No coma carne cruda ni queso sin cocinar; evite el contacto con las bandejas sanitarias de los gatos y la tierra que estos animales usan. Estos elementos contienen grmenes que pueden causar defectos congnitos en el beb.  Tome las vitaminas prenatales.  Tome entre 1500 y 2000mg de calcio diariamente comenzando en la semana20 del embarazo hasta el parto.  Si est estreida, pruebe un laxante suave (si el mdico lo autoriza). Consuma ms alimentos ricos en fibra, como vegetales y frutas frescos y cereales integrales. Beba gran cantidad de lquido para mantener la orina de tono claro o color amarillo plido.  Dese baos de asiento con agua tibia para aliviar el dolor o las molestias causadas por las hemorroides. Use una crema para las hemorroides si el mdico la autoriza.  Si tiene venas varicosas, use medias de descanso. Eleve los pies durante 15minutos, 3 o 4veces por da. Limite el consumo de sal en su dieta.  Evite levantar objetos pesados, use zapatos de tacones bajos y mantenga una buena postura.  Descanse con las piernas elevadas si tiene   calambres o dolor de cintura.  Visite a su dentista si no lo ha hecho durante el embarazo. Use un cepillo de dientes blando para higienizarse los dientes y psese el hilo dental con suavidad.  Puede seguir manteniendo relaciones sexuales, a menos que el mdico le indique lo contrario.  No haga viajes largos excepto que sea absolutamente necesario y solo con la autorizacin del mdico.  Tome clases prenatales para entender, practicar y hacer preguntas sobre el trabajo de parto y el parto.  Haga un ensayo de la partida al hospital.  Prepare el bolso que llevar al hospital.  Prepare la habitacin del beb.  Concurra a todas  las visitas prenatales segn las indicaciones de su mdico.  SOLICITE ATENCIN MDICA SI:  No est segura de que est en trabajo de parto o de que ha roto la bolsa de las aguas.  Tiene mareos.  Siente clicos leves, presin en la pelvis o dolor persistente en el abdomen.  Tiene nuseas, vmitos o diarrea persistentes.  Observa una secrecin vaginal con mal olor.  Siente dolor al orinar.  SOLICITE ATENCIN MDICA DE INMEDIATO SI:  Tiene fiebre.  Tiene una prdida de lquido por la vagina.  Tiene sangrado o pequeas prdidas vaginales.  Siente dolor intenso o clicos en el abdomen.  Sube o baja de peso rpidamente.  Tiene dificultad para respirar y siente dolor de pecho.  Sbitamente se le hinchan mucho el rostro, las manos, los tobillos, los pies o las piernas.  No ha sentido los movimientos del beb durante una hora.  Siente un dolor de cabeza intenso que no se alivia con medicamentos.  Su visin se modifica.  Esta informacin no tiene como fin reemplazar el consejo del mdico. Asegrese de hacerle al mdico cualquier pregunta que tenga. Document Released: 11/25/2004 Document Revised: 03/08/2014 Document Reviewed: 04/18/2012 Elsevier Interactive Patient Education  2017 [employer]vier Inc.  

## 2017-12-16 ENCOUNTER — Ambulatory Visit (INDEPENDENT_AMBULATORY_CARE_PROVIDER_SITE_OTHER): Payer: 59 | Admitting: Certified Nurse Midwife

## 2017-12-16 VITALS — BP 120/68 | HR 88 | Wt 170.0 lb

## 2017-12-16 DIAGNOSIS — R102 Pelvic and perineal pain: Secondary | ICD-10-CM

## 2017-12-16 DIAGNOSIS — Z3400 Encounter for supervision of normal first pregnancy, unspecified trimester: Secondary | ICD-10-CM

## 2017-12-16 DIAGNOSIS — O26899 Other specified pregnancy related conditions, unspecified trimester: Secondary | ICD-10-CM

## 2017-12-16 NOTE — Progress Notes (Signed)
Subjective:  Kristin Mcdaniel is a 22 y.o. G1P0 at [redacted]w[redacted]d being seen today for ongoing prenatal care.  She is currently monitored for the following issues for this low-risk pregnancy and has Benign heart murmur and Supervision of normal first pregnancy, antepartum on their problem list.  Patient reports bilateral lower abdominal discomfort..  Contractions: Not present. Vag. Bleeding: None.  Movement: Present. Denies leaking of fluid.   The following portions of the patient's history were reviewed and updated as appropriate: allergies, current medications, past family history, past medical history, past social history, past surgical history and problem list. Problem list updated.  Objective:   Vitals:   12/16/17 0936  BP: 120/68  Pulse: 88  Weight: 77.1 kg    Fetal Status: Fetal Heart Rate (bpm): 143 Fundal Height: 32 cm Movement: Present  Presentation: Vertex  General:  Alert, oriented and cooperative. Patient is in no acute distress.  Skin: Skin is warm and dry. No rash noted.   Cardiovascular: Normal heart rate noted  Respiratory: Normal respiratory effort, no problems with respiration noted  Abdomen: Soft, gravid, appropriate for gestational age. Pain/Pressure: Absent     Pelvic: Vag. Bleeding: None Vag D/C Character: Thin   Cervical exam deferred        Extremities: Normal range of motion.  Edema: Trace  Mental Status: Normal mood and affect. Normal behavior. Normal judgment and thought content.   Urinalysis:      Assessment and Plan:  Pregnancy: G1P0 at [redacted]w[redacted]d  1. Supervision of normal first pregnancy, antepartum  2. Round ligament pain - maternity belt - comfort measures  Preterm labor symptoms and general obstetric precautions including but not limited to vaginal bleeding, contractions, leaking of fluid and fetal movement were reviewed in detail with the patient. Please refer to After Visit Summary for other counseling recommendations.  Return in about 2 weeks (around  12/30/2017).   Donette Larry, CNM

## 2017-12-16 NOTE — Patient Instructions (Addendum)
Tercer trimestre de embarazo  (Third Trimester of Pregnancy)  El tercer trimestre comprende desde la semana29 hasta la semana42, es decir, desde el mes7 hasta el mes9. En este trimestre, el feto crece muy rpido. Hacia el final del noveno mes, el feto mide alrededor de 20pulgadas (45cm) de largo y pesa entre 6y 10libras (2,700y 4,500kg).  CUIDADOS EN EL HOGAR   No fume, no consuma hierbas ni beba alcohol. No tome frmacos que el mdico no haya autorizado.   No consuma ningn producto que contenga tabaco, lo que incluye cigarrillos, tabaco de mascar o cigarrillos electrnicos. Si necesita ayuda para dejar de fumar, consulte al mdico. Puede recibir asesoramiento u otro tipo de apoyo para dejar de fumar.   Tome los medicamentos solamente como se lo haya indicado el mdico. Algunos medicamentos son seguros para tomar durante el embarazo y otros no lo son.   Haga ejercicios solamente como se lo haya indicado el mdico. Interrumpa la actividad fsica si comienza a tener calambres.   Ingiera alimentos saludables de manera regular.   Use un sostn que le brinde buen soporte si sus mamas estn sensibles.   No se d baos de inmersin en agua caliente, baos turcos ni saunas.   Colquese el cinturn de seguridad cuando conduzca.   No coma carne cruda ni queso sin cocinar; evite el contacto con las bandejas sanitarias de los gatos y la tierra que estos animales usan.   Tome las vitaminas prenatales.   Tome entre 1500 y 2000mg de calcio diariamente comenzando en la semana20 del embarazo hasta el parto.   Pruebe tomar un medicamento que la ayude a defecar (un laxante suave) si el mdico lo autoriza. Consuma ms fibra, que se encuentra en las frutas y verduras frescas y los cereales integrales. Beba suficiente lquido para mantener el pis (orina) claro o de color amarillo plido.   Dese baos de asiento con agua tibia para aliviar el dolor o las molestias causadas por las hemorroides. Use una crema para  las hemorroides si el mdico la autoriza.   Si se le hinchan las venas (venas varicosas), use medias de descanso. Levante (eleve) los pies durante 15minutos, 3 o 4veces por da. Limite el consumo de sal en su dieta.   No levante objetos pesados, use zapatos de tacones bajos y sintese derecha.   Descanse con las piernas elevadas si tiene calambres o dolor de cintura.   Visite a su dentista si no lo ha hecho durante el embarazo. Use un cepillo de cerdas suaves para cepillarse los dientes. Psese el hilo dental con suavidad.   Puede seguir manteniendo relaciones sexuales, a menos que el mdico le indique lo contrario.   No haga viajes de larga distancia, excepto si es obligatorio y solamente con la aprobacin del mdico.   Tome clases prenatales.   Practique ir manejando al hospital.   Prepare el bolso que llevar al hospital.   Prepare la habitacin del beb.   Concurra a los controles mdicos.    SOLICITE AYUDA SI:   No est segura de si est en trabajo de parto o si ha roto la bolsa de las aguas.   Tiene mareos.   Siente calambres leves o presin en la parte inferior del abdomen.   Sufre un dolor persistente en el abdomen.   Tiene malestar estomacal (nuseas), vmitos, o tiene deposiciones acuosas (diarrea).   Advierte un olor ftido que proviene de la vagina.   Siente dolor al orinar.    SOLICITE AYUDA DE   INMEDIATO SI:   Tiene fiebre.   Tiene una prdida de lquido por la vagina.   Tiene sangrado o pequeas prdidas vaginales.   Siente dolor intenso o clicos en el abdomen.   Sube o baja de peso rpidamente.   Tiene dificultades para recuperar el aliento y siente dolor en el pecho.   Sbitamente se le hinchan mucho el rostro, las manos, los tobillos, los pies o las piernas.   No ha sentido los movimientos del beb durante una hora.   Siente un dolor de cabeza intenso que no se alivia con medicamentos.   Su visin se modifica.    Esta informacin no tiene como fin reemplazar el consejo  del mdico. Asegrese de hacerle al mdico cualquier pregunta que tenga.  Document Released: 10/18/2012 Document Revised: 03/08/2014 Document Reviewed: 04/18/2012  Elsevier Interactive Patient Education  2017 [employer] Inc[employer]  Eleccin del mtodo anticonceptivo  (Contraception Choices)  La anticoncepcin (control de la natalidad) es el uso de cualquier mtodo o dispositivo para evitar el embarazo. A continuacin se indican algunos de esos mtodos.  ANTICONCEPTIVOS HORMONALES   Un pequeo tubo colocado bajo la piel de la parte superior del brazo (implante). El tubo puede permanecer en el lugar durante 3 aos. El implante debe quitarse despus de 3 aos.   Inyecciones que se aplican cada 3 meses.   Pldoras que deben tomarse todos los das.   Parches que se cambian una vez por semana.   Un anillo que se coloca en la vagina (anillo vaginal). El anillo se deja en su lugar durante 3 semanas y se retira durante 1 semana. Luego se coloca un nuevo anillo en la vagina.   Pldoras para el control de la natalidad despus de tener sexo (relaciones sexuales) sin proteccin.    ANTICONCEPTIVOS DE BARRERA   Una cubierta delgada que se usa sobe el pene (condn masculino) que se coloca durante las relaciones sexuales.   Una cubierta blanda y suelta que se coloca en la vagina (condn femenino) antes de las relaciones sexuales.   Un dispositivo de goma que se aplica sobre el cuello del tero (diafragma). Este dispositivo debe ser hecho para usted. Se coloca en la vagina antes de tener relaciones sexuales. Debe dejarlo colocado en la vagina durante 6 a 8 horas despus de las relaciones sexuales.   Un capuchn pequeo y suave que se fija sobre el cuello del tero (capuchn cervical). Este capuchn debe ser hecho para usted. Debe dejarlo colocado en la vagina durante 48 horas despus de las relaciones sexuales.   Una esponja que se coloca en la vagina antes de tener relaciones sexuales.   Una sustancia qumica que destruye o  impide que los espermatozoides ingresen al cuello y al tero (espermicida). La sustancia qumica puede ser en crema, gel, espuma o pldoras.    DISPOSITIVO DE CONTROL INTRAUTERINO (DIU)   El DIU es un pequeo dispositivo plstico en forma de T. Se coloca dentro del tero. Hay dos tipos de DIU:  ? DIU de cobre. El dispositivo est recubierto en alambre de cobre. El cobre produce un lquido que destruye los espermatozoides. Puede permanecer colocado durante 10 aos.  ? DIU hormonal. La hormona impide que ocurra el embarazo. Puede permanecer colocado durante 5 aos.    MTODOS PERMANENTES   La mujer puede hacerse sellar, ligar u obstruir las trompas de Falopio durante una ciruga. Esto impide que el vulo llegue hasta el tero.   El mdico coloca un alambre diminuto o lo inserta en   cada una de las trompas de Falopio. Esto produce un tejido cicatrizal que obstruye las trompas de Falopio.   En el hombre pueden ligarse los conductos por los que pasan los espermatozoides (vasectoma).    CONTROL DE LA NATALIDAD POR PLANIFICACIN FAMILIAR NATURAL   La planificacin familiar natural significa no tener relaciones sexuales o usar un mtodo anticonceptivo de barrera en los perodos frtiles de la mujer.   Use a un almanaque para llevar un registro de la extensin de cada perodo y para conocer los das en los que puede quedar embarazada.   Evite tener relaciones sexuales durante la ovulacin.   Use un termmetro para medir la temperatura corporal. Tambin reconozca los sntomas de la ovulacin.   El momento de tener relaciones sexuales debe ser despus de que la mujer haya ovulado.  Use condones para protegerse de las enfermedades de transmisin sexual (ETS). Hgalo independientemente del tipo de anticonceptivo que use. Hable con su mdico acerca de cul es el mejor mtodo anticonceptivo para usted.  Esta informacin no tiene como fin reemplazar el consejo del mdico. Asegrese de hacerle al mdico cualquier pregunta  que tenga.  Document Released: 06/02/2010 Document Revised: 02/20/2013 Document Reviewed: 09/06/2012  Elsevier Interactive Patient Education  2017 [employer]vier Inc[employer]

## 2017-12-30 ENCOUNTER — Encounter: Payer: Self-pay | Admitting: *Deleted

## 2017-12-30 ENCOUNTER — Ambulatory Visit (INDEPENDENT_AMBULATORY_CARE_PROVIDER_SITE_OTHER): Payer: 59 | Admitting: Certified Nurse Midwife

## 2017-12-30 DIAGNOSIS — Z3400 Encounter for supervision of normal first pregnancy, unspecified trimester: Secondary | ICD-10-CM

## 2017-12-30 NOTE — Progress Notes (Signed)
Subjective:  Kristin Mcdaniel is a 22 y.o. G1P0 at [redacted]w[redacted]d being seen today for ongoing prenatal care.  She is currently monitored for the following issues for this low-risk pregnancy and has Benign heart murmur and Supervision of normal first pregnancy, antepartum on their problem list.  Patient reports no complaints.  Contractions: Not present. Vag. Bleeding: None.  Movement: Present. Denies leaking of fluid.   The following portions of the patient's history were reviewed and updated as appropriate: allergies, current medications, past family history, past medical history, past social history, past surgical history and problem list. Problem list updated.  Objective:   Vitals:   12/30/17 0808  BP: 116/68  Pulse: 91  Weight: 78 kg    Fetal Status: Fetal Heart Rate (bpm): 141 Fundal Height: 34 cm Movement: Present     General:  Alert, oriented and cooperative. Patient is in no acute distress.  Skin: Skin is warm and dry. No rash noted.   Cardiovascular: Normal heart rate noted  Respiratory: Normal respiratory effort, no problems with respiration noted  Abdomen: Soft, gravid, appropriate for gestational age. Pain/Pressure: Absent     Pelvic: Vag. Bleeding: None Vag D/C Character: Thin   Cervical exam deferred        Extremities: Normal range of motion.  Edema: Trace  Mental Status: Normal mood and affect. Normal behavior. Normal judgment and thought content.   Urinalysis:      Assessment and Plan:  Pregnancy: G1P0 at [redacted]w[redacted]d  1. Supervision of normal first pregnancy, antepartum -[facility] nv   Live interpreter present  Preterm labor symptoms and general obstetric precautions including but not limited to vaginal bleeding, contractions, leaking of fluid and fetal movement were reviewed in detail with the patient. Please refer to After Visit Summary for other counseling recommendations.  Return in about 2 weeks (around 01/13/2018).   Donette Larry, CNM

## 2017-12-30 NOTE — Patient Instructions (Signed)
Braxton Hicks Contractions °Contractions of the uterus can occur throughout pregnancy, but they are not always a sign that you are in labor. You may have practice contractions called Braxton Hicks contractions. These false labor contractions are sometimes confused with true labor. °What are Braxton Hicks contractions? °Braxton Hicks contractions are tightening movements that occur in the muscles of the uterus before labor. Unlike true labor contractions, these contractions do not result in opening (dilation) and thinning of the cervix. Toward the end of pregnancy (32-34 weeks), Braxton Hicks contractions can happen more often and may become stronger. These contractions are sometimes difficult to tell apart from true labor because they can be very uncomfortable. You should not feel embarrassed if you go to the hospital with false labor. °Sometimes, the only way to tell if you are in true labor is for your health care provider to look for changes in the cervix. The health care provider will do a physical exam and may monitor your contractions. If you are not in true labor, the exam should show that your cervix is not dilating and your water has not broken. °If there are other health problems associated with your pregnancy, it is completely safe for you to be sent home with false labor. You may continue to have Braxton Hicks contractions until you go into true labor. °How to tell the difference between true labor and false labor °True labor °· Contractions last 30-70 seconds. °· Contractions become very regular. °· Discomfort is usually felt in the top of the uterus, and it spreads to the lower abdomen and low back. °· Contractions do not go away with walking. °· Contractions usually become more intense and increase in frequency. °· The cervix dilates and gets thinner. °False labor °· Contractions are usually shorter and not as strong as true labor contractions. °· Contractions are usually irregular. °· Contractions  are often felt in the front of the lower abdomen and in the groin. °· Contractions may go away when you walk around or change positions while lying down. °· Contractions get weaker and are shorter-lasting as time goes on. °· The cervix usually does not dilate or become thin. °Follow these instructions at home: °· Take over-the-counter and prescription medicines only as told by your health care provider. °· Keep up with your usual exercises and follow other instructions from your health care provider. °· Eat and drink lightly if you think you are going into labor. °· If Braxton Hicks contractions are making you uncomfortable: °? Change your position from lying down or resting to walking, or change from walking to resting. °? Sit and rest in a tub of warm water. °? Drink enough fluid to keep your urine pale yellow. Dehydration may cause these contractions. °? Do slow and deep breathing several times an hour. °· Keep all follow-up prenatal visits as told by your health care provider. This is important. °Contact a health care provider if: °· You have a fever. °· You have continuous pain in your abdomen. °Get help right away if: °· Your contractions become stronger, more regular, and closer together. °· You have fluid leaking or gushing from your vagina. °· You pass blood-tinged mucus (bloody show). °· You have bleeding from your vagina. °· You have low back pain that you never had before. °· You feel your baby’s head pushing down and causing pelvic pressure. °· Your baby is not moving inside you as much as it used to. °Summary °· Contractions that occur before labor are called Braxton   Hicks contractions, false labor, or practice contractions. °· Braxton Hicks contractions are usually shorter, weaker, farther apart, and less regular than true labor contractions. True labor contractions usually become progressively stronger and regular and they become more frequent. °· Manage discomfort from Braxton Hicks contractions by  changing position, resting in a warm bath, drinking plenty of water, or practicing deep breathing. °This information is not intended to replace advice given to you by your health care provider. Make sure you discuss any questions you have with your health care provider. °Document Released: 07/01/2016 Document Revised: 07/01/2016 Document Reviewed: 07/01/2016 °Elsevier Interactive Patient Education © 2018 [employer] ° °

## 2018-01-13 ENCOUNTER — Ambulatory Visit (INDEPENDENT_AMBULATORY_CARE_PROVIDER_SITE_OTHER): Payer: 59

## 2018-01-13 VITALS — BP 120/66 | HR 85 | Wt 173.0 lb

## 2018-01-13 DIAGNOSIS — Z113 Encounter for screening for infections with a predominantly sexual mode of transmission: Secondary | ICD-10-CM

## 2018-01-13 DIAGNOSIS — Z3483 Encounter for supervision of other normal pregnancy, third trimester: Secondary | ICD-10-CM

## 2018-01-13 LAB — OB RESULTS CONSOLE GBS: STREP GROUP B AG: NEGATIVE

## 2018-01-13 LAB — OB RESULTS CONSOLE GC/CHLAMYDIA: GC PROBE AMP, GENITAL: NEGATIVE

## 2018-01-13 MED ORDER — TERCONAZOLE 0.4 % VA CREA: 1.0000 | TOPICAL_CREAM | Freq: Every day | VAGINAL | 0 refills | Status: DC

## 2018-01-13 NOTE — Progress Notes (Signed)
   PRENATAL VISIT NOTE  Subjective:  Kristin Mcdaniel is a 22 y.o. G1P0 at 6043w4d being seen today for ongoing prenatal care.  She is currently monitored for the following issues for this low-risk pregnancy and has Benign heart murmur and Supervision of normal first pregnancy, antepartum on their problem list.  Patient reports vaginal itching and irritation for 1 week.  Contractions: Not present. Vag. Bleeding: None.  Movement: Present. Denies leaking of fluid.   The following portions of the patient's history were reviewed and updated as appropriate: allergies, current medications, past family history, past medical history, past social history, past surgical history and problem list. Problem list updated.  Objective:   Vitals:   01/13/18 0821  BP: 120/66  Pulse: 85  Weight: 173 lb (78.5 kg)    Fetal Status: Fetal Heart Rate (bpm): 147 Fundal Height: 37 cm Movement: Present     General:  Alert, oriented and cooperative. Patient is in no acute distress.  Skin: Skin is warm and dry. No rash noted.   Cardiovascular: Normal heart rate noted  Respiratory: Normal respiratory effort, no problems with respiration noted  Abdomen: Soft, gravid, appropriate for gestational age.  Pain/Pressure: Present     Pelvic: Cervical exam performed Dilation: 1 Effacement (%): 50 Station: -3  Thick white clumpy discharge on glove after exam.  Extremities: Normal range of motion.  Edema: Trace  Mental Status: Normal mood and affect. Normal behavior. Normal judgment and thought content.   Assessment and Plan:  Pregnancy: G1P0 at 5843w4d  1. Encounter for supervision of other normal pregnancy in third trimester - Exam consistent for yeast, will send Rx to pharmacy - Culture, beta strep (group b only) - GC[facility]Chlamydia probe amp [facility]  Preterm labor symptoms and general obstetric precautions including but not limited to vaginal bleeding, contractions, leaking of fluid and fetal movement were  reviewed in detail with the patient. Please refer to After Visit Summary for other counseling recommendations.  Return in about 1 week (around 01/20/2018) for Return OB visit.  Future Appointments  Date Time Provider Department Center  05/15/2018  9:50 AM Sunnie NielsenAlexander, Natalie, DO [facility] None    Rolm BookbinderCaroline M Dontez Hauss, [place]  01/13/18 8:43 AM

## 2018-01-13 NOTE — Patient Instructions (Signed)

## 2018-01-16 LAB — CULTURE, BETA STREP (GROUP B ONLY)
MICRO NUMBER:: 91378807
SPECIMEN QUALITY:: ADEQUATE

## 2018-01-16 LAB — GC/CHLAMYDIA PROBE AMP (~~LOC~~) NOT AT ARMC
CHLAMYDIA, DNA PROBE: NEGATIVE
Neisseria Gonorrhea: NEGATIVE

## 2018-01-20 ENCOUNTER — Ambulatory Visit (INDEPENDENT_AMBULATORY_CARE_PROVIDER_SITE_OTHER): Payer: 59

## 2018-01-20 VITALS — BP 120/72 | HR 84 | Wt 173.0 lb

## 2018-01-20 DIAGNOSIS — Z3A37 37 weeks gestation of pregnancy: Secondary | ICD-10-CM

## 2018-01-20 DIAGNOSIS — Z3483 Encounter for supervision of other normal pregnancy, third trimester: Secondary | ICD-10-CM

## 2018-01-20 NOTE — Patient Instructions (Signed)

## 2018-01-20 NOTE — Progress Notes (Signed)
   PRENATAL VISIT NOTE  Subjective:  Kristin Mcdaniel is a 22 y.o. G1P0 at 581w4d being seen today for ongoing prenatal care.  She is currently monitored for the following issues for this low-risk pregnancy and has Benign heart murmur and Supervision of normal first pregnancy, antepartum on their problem list.  Patient reports no complaints.  Contractions: Irritability. Vag. Bleeding: None.  Movement: Present. Denies leaking of fluid.   The following portions of the patient's history were reviewed and updated as appropriate: allergies, current medications, past family history, past medical history, past social history, past surgical history and problem list. Problem list updated.  Objective:   Vitals:   01/20/18 0812  BP: 120/72  Pulse: 84  Weight: 173 lb (78.5 kg)    Fetal Status: Fetal Heart Rate (bpm): 143 Fundal Height: 38 cm Movement: Present     General:  Alert, oriented and cooperative. Patient is in no acute distress.  Skin: Skin is warm and dry. No rash noted.   Cardiovascular: Normal heart rate noted  Respiratory: Normal respiratory effort, no problems with respiration noted  Abdomen: Soft, gravid, appropriate for gestational age.  Pain/Pressure: Present     Pelvic: Cervical exam performed Dilation: 1 Effacement (%): 50 Station: -3  Extremities: Normal range of motion.  Edema: Trace  Mental Status: Normal mood and affect. Normal behavior. Normal judgment and thought content.   Assessment and Plan:  Pregnancy: G1P0 at 671w4d  1. Encounter for supervision of other normal pregnancy in third trimester - No complaints, routine care - Thick white discharge still seen on exam. Patient has not been using applicators with terazol and only using externally. Discussed proper use and encouraged patient to use internal application as well.   Term labor symptoms and general obstetric precautions including but not limited to vaginal bleeding, contractions, leaking of fluid and fetal  movement were reviewed in detail with the patient. Please refer to After Visit Summary for other counseling recommendations.  Return in about 1 week (around 01/27/2018) for Return OB visit.  Future Appointments  Date Time Provider Department Center  01/25/2018 11:15 AM Anyanwu, Jethro BastosUgonna A, MD [facility]  05/15/2018  9:50 AM Sunnie NielsenAlexander, Natalie, DO [facility] None    Elisha HeadlandCaroline M CornucopiaNeill, [place]  01/20/18 8:31 AM

## 2018-01-25 ENCOUNTER — Ambulatory Visit (INDEPENDENT_AMBULATORY_CARE_PROVIDER_SITE_OTHER): Payer: 59 | Admitting: Obstetrics & Gynecology

## 2018-01-25 VITALS — BP 124/73 | HR 84 | Wt 176.0 lb

## 2018-01-25 DIAGNOSIS — Z3403 Encounter for supervision of normal first pregnancy, third trimester: Secondary | ICD-10-CM

## 2018-01-25 DIAGNOSIS — Z3A38 38 weeks gestation of pregnancy: Secondary | ICD-10-CM

## 2018-01-25 DIAGNOSIS — Z3400 Encounter for supervision of normal first pregnancy, unspecified trimester: Secondary | ICD-10-CM

## 2018-01-25 NOTE — Progress Notes (Signed)
   PRENATAL VISIT NOTE  Subjective:  Kristin Mcdaniel is a 22 y.o. G1P0 at 1164w2d being seen today for ongoing prenatal care.  She is currently monitored for the following issues for this low-risk pregnancy and has Benign heart murmur and Supervision of normal first pregnancy, antepartum on their problem list.  Patient reports occasional contractions.  Contractions: Irregular. Vag. Bleeding: None.  Movement: Present. Denies leaking of fluid.   The following portions of the patient's history were reviewed and updated as appropriate: allergies, current medications, past family history, past medical history, past social history, past surgical history and problem list. Problem list updated.  Objective:   Vitals:   01/25/18 1120  BP: 124/73  Pulse: 84  Weight: 176 lb (79.8 kg)    Fetal Status: Fetal Heart Rate (bpm): 139 Fundal Height: 39 cm Movement: Present  Presentation: Vertex  General:  Alert, oriented and cooperative. Patient is in no acute distress.  Skin: Skin is warm and dry. No rash noted.   Cardiovascular: Normal heart rate noted  Respiratory: Normal respiratory effort, no problems with respiration noted  Abdomen: Soft, gravid, appropriate for gestational age.  Pain/Pressure: Present     Pelvic: Cervical exam performed Dilation: 1 Effacement (%): 50 Station: -3  Extremities: Normal range of motion.  Edema: Trace  Mental Status: Normal mood and affect. Normal behavior. Normal judgment and thought content.   Assessment and Plan:  Pregnancy: G1P0 at 6764w2d  1. Supervision of normal first pregnancy, antepartum Labor symptoms and general obstetric precautions including but not limited to vaginal bleeding, contractions, leaking of fluid and fetal movement were reviewed in detail with the patient. Please refer to After Visit Summary for other counseling recommendations.  Return in about 1 week (around 02/01/2018).  Future Appointments  Date Time Provider Department Center  05/15/2018   9:50 AM Sunnie NielsenAlexander, Natalie, DO [facility] None    Jaynie CollinsUgonna Irwin Toran, MD

## 2018-01-25 NOTE — Patient Instructions (Signed)
Regrese a la clinica cuando tenga su cita. Si tiene problemas o preguntas, llama a la clinica o vaya a la [facility]a de emerg[facility] al [facility].    

## 2018-01-31 ENCOUNTER — Inpatient Hospital Stay (HOSPITAL_COMMUNITY)
Admission: AD | Admit: 2018-01-31 | Discharge: 2018-02-03 | DRG: 768 | Disposition: A | Discharge status: Home / Self Care | Payer: 59 | Attending: Obstetrics & Gynecology | Admitting: Obstetrics & Gynecology

## 2018-01-31 ENCOUNTER — Encounter (HOSPITAL_COMMUNITY): Payer: Self-pay | Admitting: *Deleted

## 2018-01-31 ENCOUNTER — Ambulatory Visit (INDEPENDENT_AMBULATORY_CARE_PROVIDER_SITE_OTHER): Payer: 59 | Admitting: Advanced Practice Midwife

## 2018-01-31 VITALS — BP 127/72 | HR 93 | Wt 177.0 lb

## 2018-01-31 DIAGNOSIS — R030 Elevated blood-pressure reading, without diagnosis of hypertension: Secondary | ICD-10-CM | POA: Diagnosis present

## 2018-01-31 DIAGNOSIS — Z3400 Encounter for supervision of normal first pregnancy, unspecified trimester: Secondary | ICD-10-CM

## 2018-01-31 DIAGNOSIS — O4693 Antepartum hemorrhage, unspecified, third trimester: Secondary | ICD-10-CM

## 2018-01-31 DIAGNOSIS — O26893 Other specified pregnancy related conditions, third trimester: Secondary | ICD-10-CM | POA: Diagnosis present

## 2018-01-31 DIAGNOSIS — Z3A39 39 weeks gestation of pregnancy: Secondary | ICD-10-CM | POA: Diagnosis not present

## 2018-01-31 DIAGNOSIS — Z3403 Encounter for supervision of normal first pregnancy, third trimester: Secondary | ICD-10-CM

## 2018-01-31 DIAGNOSIS — Z3483 Encounter for supervision of other normal pregnancy, third trimester: Secondary | ICD-10-CM | POA: Diagnosis present

## 2018-01-31 DIAGNOSIS — O99824 Streptococcus B carrier state complicating childbirth: Secondary | ICD-10-CM | POA: Diagnosis not present

## 2018-01-31 HISTORY — DX: Other specified health status: Z78.9

## 2018-01-31 LAB — CBC
HCT: 38.4 % (ref 36.0–46.0)
HEMATOCRIT: 36.8 % (ref 36.0–46.0)
HEMOGLOBIN: 12.4 g/dL (ref 12.0–15.0)
Hemoglobin: 12.9 g/dL (ref 12.0–15.0)
MCH: 30.8 pg (ref 26.0–34.0)
MCH: 30.9 pg (ref 26.0–34.0)
MCHC: 33.7 g/dL (ref 30.0–36.0)
MCHC: 33.6 g/dL (ref 30.0–36.0)
MCV: 91.3 fL (ref 80.0–100.0)
MCV: 92.1 fL (ref 80.0–100.0)
NRBC: 0 % (ref 0.0–0.2)
Platelets: 173 10*3/uL (ref 150–400)
Platelets: 172 10*3/uL (ref 150–400)
RBC: 4.03 MIL/uL (ref 3.87–5.11)
RBC: 4.17 MIL/uL (ref 3.87–5.11)
RDW: 13.4 % (ref 11.5–15.5)
RDW: 13.5 % (ref 11.5–15.5)
WBC: 9.6 10*3/uL (ref 4.0–10.5)
WBC: 10 10*3/uL (ref 4.0–10.5)
nRBC: 0 % (ref 0.0–0.2)

## 2018-01-31 LAB — COMPREHENSIVE METABOLIC PANEL
ALBUMIN: 3.2 g/dL — AB (ref 3.5–5.0)
ALK PHOS: 217 U/L — AB (ref 38–126)
ALT: 13 U/L (ref 0–44)
AST: 16 U/L (ref 15–41)
Anion gap: 9 (ref 5–15)
BILIRUBIN TOTAL: 0.5 mg/dL (ref 0.3–1.2)
BUN: 9 mg/dL (ref 6–20)
CALCIUM: 9.7 mg/dL (ref 8.9–10.3)
CO2: 19 mmol/L — ABNORMAL LOW (ref 22–32)
Chloride: 108 mmol/L (ref 98–111)
Creatinine, Ser: 0.48 mg/dL (ref 0.44–1.00)
GFR calc Af Amer: >60 mL/min (ref >60)
GFR calc non Af Amer: >60 mL/min (ref >60)
GLUCOSE: 75 mg/dL (ref 70–99)
Potassium: 3.6 mmol/L (ref 3.5–5.1)
Sodium: 136 mmol/L (ref 135–145)
TOTAL PROTEIN: 6.8 g/dL (ref 6.5–8.1)

## 2018-01-31 LAB — TYPE AND SCREEN
ABO/RH(D): O POS
ANTIBODY SCREEN: NEGATIVE

## 2018-01-31 LAB — PROTEIN / CREATININE RATIO, URINE
Creatinine, Urine: 70 mg/dL
Protein Creatinine Ratio: 0.16 mg/mg{Cre} — ABNORMAL HIGH (ref 0.00–0.15)
Total Protein, Urine: 11 mg/dL

## 2018-01-31 LAB — ABO/RH: ABO/RH(D): O POS

## 2018-01-31 MED ORDER — ACETAMINOPHEN 325 MG PO TABS: 650.0000 mg | ORAL_TABLET | ORAL | Status: DC | PRN

## 2018-01-31 MED ORDER — OXYTOCIN BOLUS FROM INFUSION
500.0000 mL | Freq: Once | INTRAVENOUS | Status: AC
  Administered 2018-02-01: 500 mL via INTRAVENOUS

## 2018-01-31 MED ORDER — ONDANSETRON HCL 4 MG/2ML IJ SOLN: 4.0000 mg | Freq: Four times a day (QID) | INTRAMUSCULAR | Status: DC | PRN

## 2018-01-31 MED ORDER — SOD CITRATE-CITRIC ACID 500-334 MG/5ML PO SOLN: 30.0000 mL | ORAL | Status: DC | PRN

## 2018-01-31 MED ORDER — LACTATED RINGERS IV SOLN
INTRAVENOUS | Status: DC
  Administered 2018-01-31 – 2018-02-01 (×4): via INTRAVENOUS

## 2018-01-31 MED ORDER — FENTANYL CITRATE (PF) 100 MCG/2ML IJ SOLN
100.0000 ug | INTRAMUSCULAR | Status: DC | PRN
  Administered 2018-02-01: 100 ug via INTRAVENOUS
  Filled 2018-01-31: qty 2

## 2018-01-31 MED ORDER — LIDOCAINE HCL (PF) 1 % IJ SOLN
30.0000 mL | INTRAMUSCULAR | Status: DC | PRN
  Filled 2018-01-31: qty 30

## 2018-01-31 MED ORDER — OXYCODONE-ACETAMINOPHEN 5-325 MG PO TABS: 2.0000 | ORAL_TABLET | ORAL | Status: DC | PRN

## 2018-01-31 MED ORDER — OXYCODONE-ACETAMINOPHEN 5-325 MG PO TABS: 1.0000 | ORAL_TABLET | ORAL | Status: DC | PRN

## 2018-01-31 MED ORDER — LACTATED RINGERS IV SOLN: 500.0000 mL | INTRAVENOUS | Status: DC | PRN

## 2018-01-31 MED ORDER — OXYTOCIN 40 UNITS IN LACTATED RINGERS INFUSION - SIMPLE MED
2.5000 [IU]/h | INTRAVENOUS | Status: DC
  Administered 2018-02-02: 2.5 [IU]/h via INTRAVENOUS
  Filled 2018-01-31 (×2): qty 1000

## 2018-01-31 NOTE — MAU Provider Note (Signed)
History     CSN: [id number]  Arrival date and time: 01/31/18 1504   First Provider Initiated Contact with Patient 01/31/18 1552      Chief Complaint  Patient presents with  . Vaginal Bleeding   Kristin Mcdaniel is a 22 y.o. G1P0 at 8030w1d who presents today with vaginal bleeding. She states that she started bleeding around 0600 today. She denies any pain, but reports that she has been having contractions today as well. She denies any leaking of fluid. She reports normal fetal movement. She denies any complications with this pregnancy. She denies HA, visual disturbances or RUQ pain.   Vaginal Bleeding  The patient's primary symptoms include pelvic pain and vaginal bleeding. This is a new problem. The current episode started today. The problem occurs intermittently. The problem has been unchanged. The pain is moderate. The problem affects both sides. She is pregnant. Pertinent negatives include no chills, dysuria, fever, frequency, nausea or vomiting. The vaginal discharge was bloody. She has not been passing clots. She has not been passing tissue. Nothing aggravates the symptoms. She has tried nothing for the symptoms.    OB History    Gravida  1   Para      Term      Preterm      AB      Living        SAB      TAB      Ectopic      Multiple      Live Births              Past Medical History:  Diagnosis Date  . Medical history non-contributory     Past Surgical History:  Procedure Laterality Date  . NO PAST SURGERIES      Family History  Problem Relation Age of Onset  . Diabetes Mother   . Hyperlipidemia Mother   . Diabetes Maternal Grandmother   . Hyperlipidemia Maternal Grandmother     Social History   Tobacco Use  . Smoking status: Never Smoker  . Smokeless tobacco: Never Used  Substance Use Topics  . Alcohol use: No    Comment: occasional, social drinking, limited   . Drug use: No    Allergies: No Known Allergies  Medications Prior to  Admission  Medication Sig Dispense Refill Last Dose  . docusate sodium (COLACE) 100 MG capsule Take 1 capsule (100 mg total) by mouth 2 (two) times daily as needed. (Patient not taking: Reported on 01/13/2018) 30 capsule 2 Not Taking  . polyethylene glycol powder (MIRALAX) powder Take 17 g by mouth daily. (Patient not taking: Reported on 01/13/2018) 255 g 0 Not Taking  . Prenatal Vit-Fe Fumarate-FA (MULTIVITAMIN-PRENATAL) 27-0.8 MG TABS tablet Take 1 tablet by mouth daily at 12 noon.   Taking    Review of Systems  Constitutional: Negative for chills and fever.  Gastrointestinal: Negative for nausea and vomiting.  Genitourinary: Positive for pelvic pain and vaginal bleeding. Negative for dysuria and frequency.   Physical Exam   Blood pressure 139/85, pulse 80, temperature 98.2 F (36.8 C), temperature source Oral, resp. rate 16, last menstrual period 05/02/2017, SpO2 98 %.  Physical Exam  Nursing note and vitals reviewed. Constitutional: She is oriented to person, place, and time. She appears well-developed and well-nourished. No distress.  HENT:  Head: Normocephalic.  Cardiovascular: Normal rate.  Respiratory: Effort normal.  GI: Soft. There is no tenderness. There is no rebound.  Neurological: She is alert and oriented to  person, place, and time.  Skin: Skin is warm and dry.  Psychiatric: She has a normal mood and affect. Her behavior is normal.   NST:  Baseline: 155 Variability: moderate Accels: 15x15 Decels: none  Toco: q3-5 mins   Dilation: 2.5 Effacement (%): 70 Cervical Position: Anterior Station: -2 Presentation: Vertex Exam by:: Sebastin Perlmutter, cnm    Results for orders placed or performed during the hospital encounter of 01/31/18 (from the past 24 hour(s))  CBC     Status: None   Collection Time: 01/31/18  4:04 PM  Result Value Ref Range   WBC 9.6 4.0 - 10.5 K/uL   RBC 4.03 3.87 - 5.11 MIL/uL   Hemoglobin 12.4 12.0 - 15.0 g/dL   HCT 65.7 84.6 - 96.2 %   MCV  91.3 80.0 - 100.0 fL   MCH 30.8 26.0 - 34.0 pg   MCHC 33.7 30.0 - 36.0 g/dL   RDW 95.2 84.1 - 32.4 %   Platelets 173 150 - 400 K/uL   nRBC 0.0 0.0 - 0.2 %  Comprehensive metabolic panel     Status: Abnormal   Collection Time: 01/31/18  4:04 PM  Result Value Ref Range   Sodium 136 135 - 145 mmol/L   Potassium 3.6 3.5 - 5.1 mmol/L   Chloride 108 98 - 111 mmol/L   CO2 19 (L) 22 - 32 mmol/L   Glucose, Bld 75 70 - 99 mg/dL   BUN 9 6 - 20 mg/dL   Creatinine, Ser 4.01 0.44 - 1.00 mg/dL   Calcium 9.7 8.9 - 02.7 mg/dL   Total Protein 6.8 6.5 - 8.1 g/dL   Albumin 3.2 (L) 3.5 - 5.0 g/dL   AST 16 15 - 41 U/L   ALT 13 0 - 44 U/L   Alkaline Phosphatase 217 (H) 38 - 126 U/L   Total Bilirubin 0.5 0.3 - 1.2 mg/dL   GFR calc non Af Amer >60 >60 mL/min   GFR calc Af Amer >60 >60 mL/min   Anion gap 9 5 - 15  Protein / creatinine ratio, urine     Status: Abnormal   Collection Time: 01/31/18  4:52 PM  Result Value Ref Range   Creatinine, Urine 70.00 mg/dL   Total Protein, Urine 11 mg/dL   Protein Creatinine Ratio 0.16 (H) 0.00 - 0.15 mg/mg[Cre]    MAU Course  Procedures  MDM Patient made change from 1 to 2-3. Still with some heavy bloody show. Will admit.   Assessment and Plan  Early labor Admit to labor and delivery  Labor team notified   Thressa Sheller 01/31/2018, 3:56 PM

## 2018-01-31 NOTE — MAU Note (Signed)
Sent from office due to bleeding.

## 2018-01-31 NOTE — Progress Notes (Signed)
Pt has had a small amount of bleeding since this morning

## 2018-01-31 NOTE — H&P (Addendum)
OBSTETRIC ADMISSION HISTORY AND PHYSICAL  Kristin Mcdaniel is a 22 y.o. female G1P0 with IUP at [redacted]w[redacted]d presenting for unexplained vaginal bleeding with contractions. She was seen in clinic today with concern for bleeding which was described as approximately a pad's worth without obvious source. On presentation to MAU she was more dilated than in the office raising suspicion for bloody show in the setting of early labor.  She reports +FMs. No LOF, VB, blurry vision, headaches, peripheral edema, or RUQ pain. She plans on breastfeeding. She requests POPs for birth control.  Dating: By 1st trimester US--->  Estimated Date of Delivery: 02/06/18  Sono:   @23w , CWD, normal anatomy, cephalic presentation, 574g, 57%ile EFW  Prenatal History/Complications: None  Past Medical History: Past Medical History:  Diagnosis Date  . Medical history non-contributory     Past Surgical History: Past Surgical History:  Procedure Laterality Date  . NO PAST SURGERIES      Obstetrical History: OB History    Gravida  1   Para      Term      Preterm      AB      Living        SAB      TAB      Ectopic      Multiple      Live Births              Social History: Social History   Socioeconomic History  . Marital status: Single    Spouse name: Not on file  . Number of children: Not on file  . Years of education: Not on file  . Highest education level: Not on file  Occupational History  . Not on file  Social Needs  . Financial resource strain: Not on file  . Food insecurity:    Worry: Not on file    Inability: Not on file  . Transportation needs:    Medical: Not on file    Non-medical: Not on file  Tobacco Use  . Smoking status: Never Smoker  . Smokeless tobacco: Never Used  Substance and Sexual Activity  . Alcohol use: No    Comment: occasional, social drinking, limited   . Drug use: No  . Sexual activity: Yes  Lifestyle  . Physical activity:    Days per week: Not on  file    Minutes per session: Not on file  . Stress: Not on file  Relationships  . Social connections:    Talks on phone: Not on file    Gets together: Not on file    Attends religious service: Not on file    Active member of club or organization: Not on file    Attends meetings of clubs or organizations: Not on file    Relationship status: Not on file  Other Topics Concern  . Not on file  Social History Narrative  . Not on file    Family History: Family History  Problem Relation Age of Onset  . Diabetes Mother   . Hyperlipidemia Mother   . Diabetes Maternal Grandmother   . Hyperlipidemia Maternal Grandmother     Allergies: No Known Allergies  Medications Prior to Admission  Medication Sig Dispense Refill Last Dose  . docusate sodium (COLACE) 100 MG capsule Take 1 capsule (100 mg total) by mouth 2 (two) times daily as needed. (Patient not taking: Reported on 01/13/2018) 30 capsule 2 Not Taking  . polyethylene glycol powder (MIRALAX) powder Take 17 g by  mouth daily. (Patient not taking: Reported on 01/13/2018) 255 g 0 Not Taking  . Prenatal Vit-Fe Fumarate-FA (MULTIVITAMIN-PRENATAL) 27-0.8 MG TABS tablet Take 1 tablet by mouth daily at 12 noon.   Taking     Review of Systems   All systems reviewed and negative except as stated in HPI  Blood pressure (!) 147/89, pulse 89, temperature 98.2 F (36.8 C), temperature source Oral, resp. rate 16, last menstrual period 05/02/2017, SpO2 98 %. General appearance: alert, cooperative and no distress Lungs: regular rate and effort Heart: regular rate  Abdomen: soft, non-tender Extremities: Homans sign is negative, no sign of DVT Presentation: cephalic Fetal monitoringBaseline: 140 bpm, Variability: Good {> 6 bpm), Accelerations: Reactive and Decelerations: Absent Uterine activity: Cx q744min but irregular Dilation: 2.5 Effacement (%): 70 Station: -2 Exam by:: heather hogan, cnm    Prenatal labs: ABO, Rh: O/RH(D) POSITIVE/--  (05/15 1104) Antibody: NO ANTIBODIES DETECTED (05/15 1104) Rubella: 7.28 (05/15 1104) RPR: NON-REACTIVE (09/04 0814)  HBsAg: NON-REACTIVE (05/15 1104)  HIV: NON-REACTIVE (09/04 0814)  GBS: Negative (11/15 0000)  2 hr GTT Normal 11/02/17  Prenatal Transfer Tool  Maternal Diabetes: No Genetic Screening: Normal Maternal Ultrasounds/Referrals: Normal Fetal Ultrasounds or other Referrals:  None Maternal Substance Abuse:  No Significant Maternal Medications:  None Significant Maternal Lab Results: None  Results for orders placed or performed during the hospital encounter of 01/31/18 (from the past 24 hour(s))  CBC   Collection Time: 01/31/18  4:04 PM  Result Value Ref Range   WBC 9.6 4.0 - 10.5 K/uL   RBC 4.03 3.87 - 5.11 MIL/uL   Hemoglobin 12.4 12.0 - 15.0 g/dL   HCT 16.136.8 09.636.0 - 04.546.0 %   MCV 91.3 80.0 - 100.0 fL   MCH 30.8 26.0 - 34.0 pg   MCHC 33.7 30.0 - 36.0 g/dL   RDW 40.913.4 81.111.5 - 91.415.5 %   Platelets 173 150 - 400 K/uL   nRBC 0.0 0.0 - 0.2 %  Comprehensive metabolic panel   Collection Time: 01/31/18  4:04 PM  Result Value Ref Range   Sodium 136 135 - 145 mmol/L   Potassium 3.6 3.5 - 5.1 mmol/L   Chloride 108 98 - 111 mmol/L   CO2 19 (L) 22 - 32 mmol/L   Glucose, Bld 75 70 - 99 mg/dL   BUN 9 6 - 20 mg/dL   Creatinine, Ser 7.820.48 0.44 - 1.00 mg/dL   Calcium 9.7 8.9 - 95.610.3 mg/dL   Total Protein 6.8 6.5 - 8.1 g/dL   Albumin 3.2 (L) 3.5 - 5.0 g/dL   AST 16 15 - 41 U/L   ALT 13 0 - 44 U/L   Alkaline Phosphatase 217 (H) 38 - 126 U/L   Total Bilirubin 0.5 0.3 - 1.2 mg/dL   GFR calc non Af Amer >60 >60 mL/min   GFR calc Af Amer >60 >60 mL/min   Anion gap 9 5 - 15  Protein / creatinine ratio, urine   Collection Time: 01/31/18  4:52 PM  Result Value Ref Range   Creatinine, Urine 70.00 mg/dL   Total Protein, Urine 11 mg/dL   Protein Creatinine Ratio 0.16 (H) 0.00 - 0.15 mg/mg[Cre]    Patient Active Problem List   Diagnosis Date Noted  . Normal labor 01/31/2018  .  Supervision of normal first pregnancy, antepartum 07/13/2017  . Benign heart murmur 05/10/2016    Assessment: Kristin Mcdaniel is a 22 y.o. female G1P0 with IUP at 408w1d presenting for unexplained vaginal bleeding with contractions.  Presumed SOL with significant bloody show given relatively mild description of bleeding as primarily on toilet paper and a pad's worth at most. Contractions have produced cervical change in spite of patient's current report of no discomfort. Plan for management of SOL with augmentation as necessary for adequate progress.    Plan: 1. Labor: SOL, monitor with repeat cervical exam 4 hours from admission and consider augmentation as necessary. 2. FWB: Tolerating labor well at this time. Continue to monitor. 3. Pain: No pain management required at this time.  4. GBS: Negative  Awilda Metro, MD  01/31/2018, 6:47 PM   Midwife attestation: I have seen and examined this patient; I agree with above documentation in the resident's note.   PE: Gen: calm comfortable, NAD Resp: normal effort and rate Abd: gravid  ROS, labs, PMH reviewed  Assessment/Plan: Kristin Mcdaniel is a 22 y.o. G1P1001 here for early labor Admit to LD Labor: latent FWB: Cat I ID: GBS neg BP mildly elevated, PEC labs normal, will continue to monitor Augment with Pitocin if no cervical change  Donette Larry, CNM  02/03/2018, 10:11 AM

## 2018-01-31 NOTE — Progress Notes (Signed)
   PRENATAL VISIT NOTE  Subjective:  Kristin Mcdaniel is a 22 y.o. G1P0 at 411w1d being seen today for ongoing prenatal care.  She is currently monitored for the following issues for this low-risk pregnancy and has Benign heart murmur and Supervision of normal first pregnancy, antepartum on their problem list.  Patient reports bleeding.  Contractions: Irregular. Vag. Bleeding: Small.  Movement: Present. Denies leaking of fluid.   The following portions of the patient's history were reviewed and updated as appropriate: allergies, current medications, past family history, past medical history, past social history, past surgical history and problem list. Problem list updated.  Objective:   Vitals:   01/31/18 1327  BP: 127/72  Pulse: 93  Weight: 80.3 kg    Fetal Status: Fetal Heart Rate (bpm): 137   Movement: Present  Presentation: Vertex  General:  Alert, oriented and cooperative. Patient is in no acute distress.  Skin: Skin is warm and dry. No rash noted.   Cardiovascular: Normal heart rate noted  Respiratory: Normal respiratory effort, no problems with respiration noted  Abdomen: Soft, gravid, appropriate for gestational age.  Pain/Pressure: Present     Pelvic: Cervical exam performed Dilation: 1 Effacement (%): 50 Station: -2  Extremities: Normal range of motion.  Edema: Trace  Mental Status: Normal mood and affect. Normal behavior. Normal judgment and thought content.   Assessment and Plan:  Pregnancy: G1P0 at 2611w1d  1. Supervision of normal first pregnancy, antepartum --Pregnancy uncomplicated until bleeding today.  2. Vaginal bleeding in pregnancy, third trimester --Bright red bleeding requiring a light pad but not soaking pads.  --SSE with small amount dark red bleeding with small clots but no clear evidence of cervical bleeding.   --No cramping/contractions or other signs of labor.  Cervix unchanged at 1 cm. --Will sent to [facility]U for further evaluation and NST  Preterm labor  symptoms and general obstetric precautions including but not limited to vaginal bleeding, contractions, leaking of fluid and fetal movement were reviewed in detail with the patient. Please refer to After Visit Summary for other counseling recommendations.  No follow-ups on file.  Future Appointments  Date Time Provider Department Center  02/03/2018  9:00 AM Sharyon CableRogers, Veronica C, CNM [facility]  05/15/2018  9:50 AM Sunnie NielsenAlexander, Natalie, DO [facility] None    Sharen CounterLisa Leftwich-Kirby, CNM

## 2018-01-31 NOTE — Progress Notes (Signed)
Patient ID: Kristin Mcdaniel, female   DOB: [birth year], 22 y.o.   MRN: [id number] Kristin Mcdaniel is a 22 y.o. G1P0 at 3927w1d admitted for induction of labor due to d/t bright red bleeding, elevated bp x 2 (not 4hr apart).  Subjective: Feeling contractions, doesn't plan any IV pain meds/epidural at this time. Denies ha, visual changes, ruq/epigastric pain, n/v.    Objective: BP 124/79   Pulse (!) 103   Temp 98.3 F (36.8 C) (Oral)   Resp 18   LMP 05/02/2017   SpO2 98%  No intake/output data recorded.  FHT:  FHR: 150 bpm, variability: moderate,  accelerations:  Present,  decelerations:  Absent UC:   q 2-355mins  SVE:   Dilation: 3.5 Effacement (%): 70 Station: -2 Exam by:: Grace BushyBooker, CNM   Scant bloody show  Labs: Lab Results  Component Value Date   WBC 10.0 01/31/2018   HGB 12.9 01/31/2018   HCT 38.4 01/31/2018   MCV 92.1 01/31/2018   PLT 172 01/31/2018    Assessment / Plan: Spontaneous labor, progressing normally, early labor, changing on her own, will monitor for need for augmentation  Labor: Progressing normally Fetal Wellbeing:  Category I Pain Control:  Labor support without medications Pre-eclampsia: has only had 2 elevated bp's, not 4hr hours apart, pre-e labs normal I/D:  n/a Anticipated MOD:  NSVD  Cheral MarkerKimberly R Shaianne Nucci CNM, WHNP-BC 01/31/2018, 10:23 PM

## 2018-01-31 NOTE — Anesthesia Pain Management Evaluation Note (Signed)
  CRNA Pain Management Visit Note  Patient: Kristin Mcdaniel, 22 y.o., female  "Hello I am a [facility]. We have an anesthesia team available at all times to provide care throughout the hospital, including epidural management and anesthesia for C-section. I don't know your plan for the delivery whether it a natural birth, water birth, IV sedation, nitrous supplementation, doula or epidural, but we want to meet your pain goals."   1.Was your pain managed to your expectations on prior hospitalizations?   No prior hospitalizations  2.What is your expectation for pain management during this hospitalization?     Epidural, IV pain meds and Nitrous Oxide  3.How can we help you reach that goal? Be avaible  Record the patient's initial score and the patient's pain goal.   Pain: 4  Pain Goal: 7 [facility] wants you to be able to say your pain was always managed very well.  [facility] Rehabilitation CenterMERRITT,Rosalena Mccorry 01/31/2018

## 2018-02-01 ENCOUNTER — Inpatient Hospital Stay (HOSPITAL_COMMUNITY): Payer: 59 | Admitting: Anesthesiology

## 2018-02-01 LAB — CBC
HCT: 35.9 % — ABNORMAL LOW (ref 36.0–46.0)
Hemoglobin: 12.3 g/dL (ref 12.0–15.0)
MCH: 31.3 pg (ref 26.0–34.0)
MCHC: 34.3 g/dL (ref 30.0–36.0)
MCV: 91.3 fL (ref 80.0–100.0)
Platelets: 170 10*3/uL (ref 150–400)
RBC: 3.93 MIL/uL (ref 3.87–5.11)
RDW: 13.3 % (ref 11.5–15.5)
WBC: 12.7 10*3/uL — ABNORMAL HIGH (ref 4.0–10.5)
nRBC: 0 % (ref 0.0–0.2)

## 2018-02-01 LAB — RPR: RPR Ser Ql: NONREACTIVE

## 2018-02-01 MED ORDER — OXYTOCIN 40 UNITS IN LACTATED RINGERS INFUSION - SIMPLE MED
1.0000 m[IU]/min | INTRAVENOUS | Status: DC
  Administered 2018-02-01: 2 m[IU]/min via INTRAVENOUS

## 2018-02-01 MED ORDER — SODIUM CHLORIDE 0.9 % IV SOLN
2.0000 g | Freq: Four times a day (QID) | INTRAVENOUS | Status: DC
  Administered 2018-02-01: 2 g via INTRAVENOUS
  Filled 2018-02-01 (×3): qty 2000

## 2018-02-01 MED ORDER — FENTANYL 2.5 MCG/ML BUPIVACAINE 1/10 % EPIDURAL INFUSION (WH - ANES)
14.0000 mL/h | INTRAMUSCULAR | Status: DC | PRN
  Administered 2018-02-01 (×2): 14 mL/h via EPIDURAL
  Filled 2018-02-01 (×3): qty 100

## 2018-02-01 MED ORDER — EPHEDRINE 5 MG/ML INJ
10.0000 mg | INTRAVENOUS | Status: DC | PRN
  Filled 2018-02-01: qty 2

## 2018-02-01 MED ORDER — DIPHENHYDRAMINE HCL 50 MG/ML IJ SOLN: 12.5000 mg | INTRAMUSCULAR | Status: DC | PRN

## 2018-02-01 MED ORDER — PHENYLEPHRINE 40 MCG/ML (10ML) SYRINGE FOR IV PUSH (FOR BLOOD PRESSURE SUPPORT)
80.0000 ug | PREFILLED_SYRINGE | INTRAVENOUS | Status: DC | PRN
  Filled 2018-02-01: qty 5
  Filled 2018-02-01: qty 10

## 2018-02-01 MED ORDER — TERBUTALINE SULFATE 1 MG/ML IJ SOLN
0.2500 mg | Freq: Once | INTRAMUSCULAR | Status: DC | PRN
  Filled 2018-02-01: qty 1

## 2018-02-01 MED ORDER — LACTATED RINGERS IV SOLN: 500.0000 mL | Freq: Once | INTRAVENOUS | Status: DC

## 2018-02-01 MED ORDER — ACETAMINOPHEN 325 MG PO TABS
650.0000 mg | ORAL_TABLET | Freq: Once | ORAL | Status: AC
  Administered 2018-02-01: 650 mg via ORAL
  Filled 2018-02-01: qty 2

## 2018-02-01 MED ORDER — PHENYLEPHRINE 40 MCG/ML (10ML) SYRINGE FOR IV PUSH (FOR BLOOD PRESSURE SUPPORT)
80.0000 ug | PREFILLED_SYRINGE | INTRAVENOUS | Status: DC | PRN
  Filled 2018-02-01: qty 5

## 2018-02-01 MED ORDER — GENTAMICIN SULFATE 40 MG/ML IJ SOLN
5.0000 mg/kg | INTRAVENOUS | Status: DC
  Administered 2018-02-01: 300 mg via INTRAVENOUS
  Filled 2018-02-01: qty 7.5

## 2018-02-01 MED ORDER — LIDOCAINE HCL (PF) 1 % IJ SOLN
INTRAMUSCULAR | Status: DC | PRN
  Administered 2018-02-01: 8 mL via EPIDURAL

## 2018-02-01 NOTE — Anesthesia Procedure Notes (Signed)
Epidural Patient location during procedure: OB Start time: 02/01/2018 8:20 AM End time: 02/01/2018 8:25 AM  Staffing Anesthesiologist: Bethena Midgetddono, Jhace Fennell, MD  Preanesthetic Checklist Completed: patient identified, site marked, surgical consent, pre-op evaluation, timeout performed, IV checked, risks and benefits discussed and monitors and equipment checked  Epidural Patient position: sitting Prep: site prepped and draped and DuraPrep Patient monitoring: continuous pulse ox and blood pressure Approach: midline Location: L3-L4 Injection technique: LOR air  Needle:  Needle type: Tuohy  Needle gauge: 17 G Needle length: 9 cm and 9 Needle insertion depth: 6 cm Catheter type: closed end flexible Catheter size: 19 Gauge Catheter at skin depth: 10 cm Test dose: negative  Assessment Events: blood not aspirated, injection not painful, no injection resistance, negative IV test and no paresthesia

## 2018-02-01 NOTE — Progress Notes (Signed)
Patient ID: Kristin Mcdaniel, female   DOB: 07-31-1995, 22 y.o.   MRN: [id number] Kristin Mcdaniel is a 22 y.o. G1P0 at [redacted]w[redacted]d admitted for early labor, bright red bleeding  Subjective: Uncomfortable w/ uc's  Objective: BP 129/83   Pulse 86   Temp 97.8 F (36.6 C) (Oral)   Resp 17   Ht 5' (1.524 m)   Wt 80.3 kg   LMP 05/02/2017   SpO2 98%   BMI 34.57 kg/m  No intake/output data recorded.  FHT:  FHR: 135 bpm, variability: moderate,  accelerations:  Present,  decelerations:  Absent UC:   q 2-647mins  SVE:   Dilation: 4 Effacement (%): 80 Station: -1 Exam by:: Marius Ditchaniell Wade, Rn  Normal bloody show  Pitocin @ 10 mu/min  Labs: Lab Results  Component Value Date   WBC 10.0 01/31/2018   HGB 12.9 01/31/2018   HCT 38.4 01/31/2018   MCV 92.1 01/31/2018   PLT 172 01/31/2018    Assessment / Plan: early labor, augmenting w/ pitocin  Labor: Progressing normally Fetal Wellbeing:  Category I Pain Control:  Labor support without medications Pre-eclampsia: bp's normal since 1830 I/D:  n/a Anticipated MOD:  NSVD  Cheral MarkerKimberly R Juston Goheen CNM, WHNP-BC 02/01/2018, 4:42 AM

## 2018-02-01 NOTE — Progress Notes (Signed)
Patient ID: Kristin CaoItzel Keagle, female   DOB: 12/21/1995, 22 y.o.   MRN: [id number] Kristin Mcdaniel is a 22 y.o. G1P0 at 3622w2d admitted for early labor, bright red bleeding  Subjective: Patient comfortable. Blood pressures remain normotensive with other vitals wnl.  Objective: BP 117/74   Pulse 93   Temp 98.7 F (37.1 C) (Oral)   Resp 20   Ht 5' (1.524 m)   Wt 80.3 kg   LMP 05/02/2017   SpO2 95%   BMI 34.57 kg/m  No intake/output data recorded.  FHT:  FHR: 150 bpm, variability: moderate,  Accelerations present,  decelerations:  Absent UC:   q112mins  SVE:   Dilation: 7 Effacement (%): 100 Station: Plus 1 Exam by:: Dr. Gerri LinsPeiffer   Labs: Lab Results  Component Value Date   WBC 12.7 (H) 02/01/2018   HGB 12.3 02/01/2018   HCT 35.9 (L) 02/01/2018   MCV 91.3 02/01/2018   PLT 170 02/01/2018    Assessment / Plan: Active labor progressing since AROM.  Will start IUPC if progression stalls.   Labor: Progressing normally.  Fetal Wellbeing:  Category I Pain Control:  Epidural Pre-eclampsia: bp's normal since 1830 I/D:  n/a Anticipated MOD:  NSVD  Kristin MetroSarah A Krysten Veronica MD PGY-1 02/01/2018, 3:43 PM

## 2018-02-01 NOTE — Anesthesia Preprocedure Evaluation (Signed)
Anesthesia Evaluation  Patient identified by MRN, date of birth, ID band Patient awake    Reviewed: Allergy & Precautions, H&P , NPO status , Patient's Chart, lab work & pertinent test results, reviewed documented beta blocker date and time   Airway Mallampati: III  TM Distance: >3 FB Neck ROM: full    Dental no notable dental hx.    Pulmonary neg pulmonary ROS,    Pulmonary exam normal breath sounds clear to auscultation       Cardiovascular negative cardio ROS Normal cardiovascular exam Rhythm:regular Rate:Normal     Neuro/Psych negative neurological ROS  negative psych ROS   GI/Hepatic negative GI ROS, Neg liver ROS,   Endo/Other  negative endocrine ROS  Renal/GU negative Renal ROS  negative genitourinary   Musculoskeletal   Abdominal   Peds  Hematology negative hematology ROS (+)   Anesthesia Other Findings   Reproductive/Obstetrics (+) Pregnancy                             Anesthesia Physical Anesthesia Plan  ASA: II  Anesthesia Plan: Epidural   Post-op Pain Management:    Induction:   PONV Risk Score and Plan:   Airway Management Planned:   Additional Equipment:   Intra-op Plan:   Post-operative Plan:   Informed Consent: I have reviewed the patients History and Physical, chart, labs and discussed the procedure including the risks, benefits and alternatives for the proposed anesthesia with the patient or authorized representative who has indicated his/her understanding and acceptance.     Plan Discussed with:   Anesthesia Plan Comments:         Anesthesia Quick Evaluation  

## 2018-02-01 NOTE — Progress Notes (Signed)
Patient ID: Kristin CaoItzel Doukas, female   DOB: [birth year], 22 y.o.   MRN: [id number] Kristin Mcdaniel is a 22 y.o. G1P0 at 544w2d admitted for early labor, bright red bleeding  Subjective: S/p epidural, comfortable. Checked at that time and continuing to progress as below.  Objective: BP 116/61   Pulse 84   Temp 98 F (36.7 C) (Oral)   Resp 18   Ht 5' (1.524 m)   Wt 80.3 kg   LMP 05/02/2017   SpO2 93%   BMI 34.57 kg/m  No intake/output data recorded.  FHT:  FHR: 145 bpm, variability: moderate,  accelerations:  Present,  decelerations:  Absent UC:   q 2-667mins  SVE:   Dilation: 5 Effacement (%): 90 Station: -2 Exam by:: Raliegh Ipatherine Stout RN  Normal bloody show  Labs: Lab Results  Component Value Date   WBC 12.7 (H) 02/01/2018   HGB 12.3 02/01/2018   HCT 35.9 (L) 02/01/2018   MCV 91.3 02/01/2018   PLT 170 02/01/2018    Assessment / Plan: early labor, augmenting w/ pitocin  Labor: Progressing normally Fetal Wellbeing:  Category I Pain Control:  Epidural Pre-eclampsia: bp's normal since 1830 I/D:  n/a Anticipated MOD:  NSVD  Awilda MetroSarah A Aasha Dina MD PGY-1 02/01/2018, 10:06 AM

## 2018-02-01 NOTE — Progress Notes (Signed)
LABOR PROGRESS NOTE  Kristin Mcdaniel is a 22 y.o. G1P0 at 7425w2d  Subjective: Patient states she is feeling well  Objective: BP (!) 159/85 Comment: taken during contraction   Pulse 98   Temp 99 F (37.2 C) (Oral)   Resp 20   Ht 5' (1.524 m)   Wt 80.3 kg   LMP 05/02/2017   SpO2 95%   BMI 34.57 kg/m  or  Vitals:   02/01/18 2105 02/01/18 2130 02/01/18 2131 02/01/18 2141  BP: 128/79  (!) 159/85   Pulse: (!) 105  98   Resp: 20 20    Temp:    99 F (37.2 C)  TempSrc:    Oral  SpO2:      Weight:      Height:        Dilation: 10 Dilation Complete Date: 02/01/18 Dilation Complete Time: 2026 Effacement (%): 100 Cervical Position: Anterior Station: Plus 1 Presentation: Vertex Exam by:: Beitz RN  FHT: baseline rate 150, moderate varibility, positive acel, negative decel Toco: 1-4 strong  Labs: Lab Results  Component Value Date   WBC 12.7 (H) 02/01/2018   HGB 12.3 02/01/2018   HCT 35.9 (L) 02/01/2018   MCV 91.3 02/01/2018   PLT 170 02/01/2018    Patient Active Problem List   Diagnosis Date Noted  . Normal labor 01/31/2018  . Supervision of normal first pregnancy, antepartum 07/13/2017  . Benign heart murmur 05/10/2016    Assessment / Plan: 22 y.o. G1P0 at 5125w2d here for IOL  Labor: active labor, patient is pushing with nurse in different positions Fetal Wellbeing:  Category I Pain Control:  Epidural in place Anticipated MOD:  vaginal  Chelsey Anderson DO 02/01/2018, 9:51 PM

## 2018-02-01 NOTE — Progress Notes (Signed)
Patient ID: Barbaraann CaoItzel Lumbert, female   DOB: 11/27/1995, 22 y.o.   MRN: [id number] Barbaraann Caotzel Carrender is a 22 y.o. G1P0 at 4075w2d admitted for early labor, bright red bleeding  Subjective: Patient reporting increasing discomfort through epidural. Describes pain throughout abdomen and low pelvis pressure.  Objective: BP 135/70   Pulse 94   Temp 98.4 F (36.9 C) (Oral)   Resp 18   Ht 5' (1.524 m)   Wt 80.3 kg   LMP 05/02/2017   SpO2 95%   BMI 34.57 kg/m  No intake/output data recorded.  FHT:  FHR: 150 bpm, variability: moderate,  Accelerations present,  decelerations:  Absent UC:   q872mins  SVE:   Dilation: 9 Effacement (%): 100 Station: Plus 1 Exam by:: Raliegh Ipatherine Stout RN and Dr. Gerri LinsPeiffer   Labs: Lab Results  Component Value Date   WBC 12.7 (H) 02/01/2018   HGB 12.3 02/01/2018   HCT 35.9 (L) 02/01/2018   MCV 91.3 02/01/2018   PLT 170 02/01/2018    Assessment / Plan: Continued progress since last check with normal labor curve. Decided against IUPC given progress and increasing discomfort.   Labor: Progressing normally.  Fetal Wellbeing:  Category I Pain Control:  Epidural Pre-eclampsia: bp's normal since 1830 I/D:  n/a Anticipated MOD:  NSVD  Awilda MetroSarah A Mikale Silversmith MD PGY-1 02/01/2018, 6:33 PM

## 2018-02-01 NOTE — Progress Notes (Signed)
Patient ID: Kristin CaoItzel Mcdaniel, female   DOB: 05/23/1995, 22 y.o.   MRN: [id number] Kristin Mcdaniel is a 22 y.o. G1P0 at 3661w2d admitted for early labor, bright red bleeding  Subjective: Bulging bag noted at last nursing check. Patient agreeable to AROM.  Objective: BP 116/64   Pulse 95   Temp 98.3 F (36.8 C) (Oral)   Resp 16   Ht 5' (1.524 m)   Wt 80.3 kg   LMP 05/02/2017   SpO2 95%   BMI 34.57 kg/m  No intake/output data recorded.  FHT:  FHR: 145 bpm, variability: moderate,  Accelerations not present,  decelerations:  Absent UC:   q52mins  SVE:   Dilation: 6 Effacement (%): 90 Station: -1 Exam by:: Dr. Gerri LinsPeiffer  Normal bloody show AROM with clear fluid  Labs: Lab Results  Component Value Date   WBC 12.7 (H) 02/01/2018   HGB 12.3 02/01/2018   HCT 35.9 (L) 02/01/2018   MCV 91.3 02/01/2018   PLT 170 02/01/2018    Assessment / Plan: Active labor, AROM completed with clear fluid.   Labor: Progressing normally Fetal Wellbeing:  Category I Pain Control:  Epidural Pre-eclampsia: bp's normal since 1830 I/D:  n/a Anticipated MOD:  NSVD  Awilda MetroSarah A Deunte Bledsoe MD PGY-1 02/01/2018, 12:57 PM

## 2018-02-01 NOTE — Progress Notes (Signed)
Patient ID: Barbaraann CaoItzel Gatliff, female   DOB: 1996-01-18, 22 y.o.   MRN: [id number] Barbaraann Caotzel Shells is a 22 y.o. G1P0 at 921w2d admitted for labor/bright red bleeding  Subjective: Doing well, no complaints  Objective: BP 122/73   Pulse 90   Temp 97.8 F (36.6 C) (Oral)   Resp 16   Ht 5' (1.524 m)   Wt 80.3 kg   LMP 05/02/2017   SpO2 98%   BMI 34.57 kg/m  No intake/output data recorded.  FHT:  FHR: 135 bpm, variability: moderate,  accelerations:  Present,  decelerations:  Absent UC:   q 5-1210mins  SVE:   Dilation: 3.5 Effacement (%): 80 Station: -2 Exam by:: GoogleBeitz RN    Labs: Lab Results  Component Value Date   WBC 10.0 01/31/2018   HGB 12.9 01/31/2018   HCT 38.4 01/31/2018   MCV 92.1 01/31/2018   PLT 172 01/31/2018    Assessment / Plan: spontaneous labor, no change since last exam, will augment w/ pitocin per protocol  Labor: Progressing normally Fetal Wellbeing:  Category I Pain Control:  Labor support without medications Pre-eclampsia: n/a I/D:  n/a Anticipated MOD:  NSVD  Cheral MarkerKimberly R Ogechi Kuehnel CNM, WHNP-BC 02/01/2018, 12:52 AM

## 2018-02-02 ENCOUNTER — Encounter (HOSPITAL_COMMUNITY): Payer: Self-pay

## 2018-02-02 DIAGNOSIS — Z3A39 39 weeks gestation of pregnancy: Secondary | ICD-10-CM

## 2018-02-02 DIAGNOSIS — O99824 Streptococcus B carrier state complicating childbirth: Secondary | ICD-10-CM

## 2018-02-02 LAB — CBC
HCT: 32 % — ABNORMAL LOW (ref 36.0–46.0)
Hemoglobin: 10.9 g/dL — ABNORMAL LOW (ref 12.0–15.0)
MCH: 31 pg (ref 26.0–34.0)
MCHC: 34.1 g/dL (ref 30.0–36.0)
MCV: 90.9 fL (ref 80.0–100.0)
Platelets: 171 10*3/uL (ref 150–400)
RBC: 3.52 MIL/uL — ABNORMAL LOW (ref 3.87–5.11)
RDW: 13.2 % (ref 11.5–15.5)
WBC: 18 10*3/uL — ABNORMAL HIGH (ref 4.0–10.5)
nRBC: 0.1 % (ref 0.0–0.2)

## 2018-02-02 MED ORDER — ACETAMINOPHEN 325 MG PO TABS
650.0000 mg | ORAL_TABLET | ORAL | Status: DC | PRN
  Administered 2018-02-03: 650 mg via ORAL
  Filled 2018-02-02: qty 2

## 2018-02-02 MED ORDER — ONDANSETRON HCL 4 MG/2ML IJ SOLN: 4.0000 mg | INTRAMUSCULAR | Status: DC | PRN

## 2018-02-02 MED ORDER — IBUPROFEN 600 MG PO TABS
600.0000 mg | ORAL_TABLET | Freq: Four times a day (QID) | ORAL | Status: DC
  Administered 2018-02-02 – 2018-02-03 (×6): 600 mg via ORAL
  Filled 2018-02-02 (×6): qty 1

## 2018-02-02 MED ORDER — TETANUS-DIPHTH-ACELL PERTUSSIS 5-2.5-18.5 LF-MCG/0.5 IM SUSP: 0.5000 mL | Freq: Once | INTRAMUSCULAR | Status: DC

## 2018-02-02 MED ORDER — DIPHENHYDRAMINE HCL 25 MG PO CAPS: 25.0000 mg | ORAL_CAPSULE | Freq: Four times a day (QID) | ORAL | Status: DC | PRN

## 2018-02-02 MED ORDER — SENNOSIDES-DOCUSATE SODIUM 8.6-50 MG PO TABS
2.0000 | ORAL_TABLET | ORAL | Status: DC
  Administered 2018-02-03: 2 via ORAL
  Filled 2018-02-02: qty 2

## 2018-02-02 MED ORDER — WITCH HAZEL-GLYCERIN EX PADS: 1.0000 "application " | MEDICATED_PAD | CUTANEOUS | Status: DC | PRN

## 2018-02-02 MED ORDER — PRENATAL MULTIVITAMIN CH
1.0000 | ORAL_TABLET | Freq: Every day | ORAL | Status: DC
  Administered 2018-02-02 – 2018-02-03 (×2): 1 via ORAL
  Filled 2018-02-02 (×2): qty 1

## 2018-02-02 MED ORDER — SIMETHICONE 80 MG PO CHEW: 80.0000 mg | CHEWABLE_TABLET | ORAL | Status: DC | PRN

## 2018-02-02 MED ORDER — BENZOCAINE-MENTHOL 20-0.5 % EX AERO
1.0000 "application " | INHALATION_SPRAY | CUTANEOUS | Status: DC | PRN
  Administered 2018-02-02: 1 via TOPICAL
  Filled 2018-02-02: qty 56

## 2018-02-02 MED ORDER — COCONUT OIL OIL
1.0000 "application " | TOPICAL_OIL | Status: DC | PRN
  Administered 2018-02-02: 1 via TOPICAL
  Filled 2018-02-02: qty 120

## 2018-02-02 MED ORDER — ZOLPIDEM TARTRATE 5 MG PO TABS: 5.0000 mg | ORAL_TABLET | Freq: Every evening | ORAL | Status: DC | PRN

## 2018-02-02 MED ORDER — ONDANSETRON HCL 4 MG PO TABS: 4.0000 mg | ORAL_TABLET | ORAL | Status: DC | PRN

## 2018-02-02 MED ORDER — DIBUCAINE 1 % RE OINT: 1.0000 "application " | TOPICAL_OINTMENT | RECTAL | Status: DC | PRN

## 2018-02-02 NOTE — Lactation Note (Signed)
This note was copied from a baby's chart. Lactation Consultation Note  Patient Name: Kristin Mcdaniel BDHDI'X Date: 02/02/2018 Reason for consult: Follow-up assessment;Primapara;1st time breastfeeding;Term  59 hours old FT female who is being exclusively BF by her mother, she's a P1. Per mom baby hasn't fed since around noon, and she's worried about baby not getting enough. Explained to mom typical newborn behavior the first 24 hours, and offered assistance with latch. When reviewing hand expression, a small drop of colostrum came out of her left nipple, and LC showed parents how to finger feed.  Mom agreed to wake baby up to feed; LC took baby STS to mom's left breast, but baby not able to latch. LC then tried some suck training and the following attempt was with a NS #20 but no luck, baby would not open her mouth or suck at a gloved finger; she was very sleepy.  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 [name] set it up, and reviewed instructions, cleaning and storage with mom, as well as milk storage guidelines. Mom started pumping right away, she was still pumping when exiting the room.  Mom is open to supplement if needed, she came as breast/bottle. Her nipples/areola complex is still showing some edema, she did bring a nursing bra to the hospital but hasn't started using her breast shells yet. Explained to mom the purpose of the shells and how they can help to lessen the areola edema for a better latch.  Feeding plan:  1. Encouraged mom to feed baby STS 8-12 times/24 hours or sooner if feeding cues are present; will use NS # 20 ad lib 2. Mom will start double pumping after feedings/attempts every 3 hours and at least once at night, a minimum of 8 pumping sessions/24 hours 3. She'll also start wearing her breast shells tomorrow  Mom will keep charting all  attempts in baby's feeding diary and will call for assistance when needed. She reported all questions and concerns were answered, she's aware of [name] services and will call PRN.  Maternal Data    Feeding Feeding Type: Breast Fed(attempt)  Interventions Interventions: Breast feeding basics reviewed;Assisted with latch;Skin to skin;Breast massage;Breast compression;Adjust position;Hand express;Support pillows;Shells;DEBP  Lactation Tools Discussed/Used Tools: Pump;Nipple Shields Nipple shield size: 20 Breast pump type: Double-Electric Breast Pump Pump Review: Setup, frequency, and cleaning;Milk Storage Initiated by:: MPeck Date initiated:: 02/02/18   Consult Status Consult Status: Follow-up Date: 02/03/18 Follow-up type: In-patient    Kristin Mcdaniel Kristin Mcdaniel 02/02/2018, 5:55 PM

## 2018-02-02 NOTE — Progress Notes (Signed)
Post Partum Day 1 Subjective: no complaints, up ad lib, voiding and tolerating PO  Objective: Blood pressure 136/68, pulse 75, temperature 98.6 F (37 C), temperature source Oral, resp. rate 18, height 5' (1.524 m), weight 80.3 kg, last menstrual period 05/02/2017, SpO2 96 %, unknown if currently breastfeeding.  Physical Exam:  General: alert, cooperative and no distress Lochia: appropriate Uterine Fundus: firm Incision: n/a DVT Evaluation: No evidence of DVT seen on physical exam.  Recent Labs    02/01/18 0749 02/02/18 0606  HGB 12.3 10.9*  HCT 35.9* 32.0*    Assessment/Plan: Plan for discharge tomorrow and Breastfeeding   LOS: 2 days   Kristin Mcdaniel 02/02/2018, 7:44 AM

## 2018-02-02 NOTE — Lactation Note (Signed)
This note was copied from a baby's chart. Lactation Consultation Note Baby 3 hrs old. FOB interpreter for mom. Hasn't fed on breast. Mom has large heavy breast, flat non-compressible nipples. Edematous areola. Reverse pressure w/little improvement. Fitted mom w/#24 NS. To large for baby's very small mouth. #20 NS fits ok. Will not be able to tell until baby is latching. Baby to sleepy at this time. Has no interest in BF. W/gloved finger stimulated baby to suck. Baby suckled a few times, placed at breast in football position, baby wouldn't do anything. Newborn behavior, STS, I&O, supply and demand discussed. Mom is breast/formula feeding. Encouraged BF first.  Hand expression taught. Breast not hardly compressible, not colostrum noted.  Shells given, demonstrated and strongly encouraged to wear them in am.  Hand pump to pre-pump before application of NS. Mom shown how to use DEBP & how to disassemble, clean, & reassemble parts. Mom knows to pump q3h for 15-20 min. Mom stated she is tired and needs to sleep. Please set up later. Encouraged to stimulate baby to feed every 3 hrs if hasn't cued to feed. Notify RN if not feeding. Encouraged mom to call for assistance or questions. WH/LC brochure given w/resources, support groups and LC services.  Patient Name: Kristin Mcdaniel ZOXWR'UToday's Date: 02/02/2018 Reason for consult: Initial assessment;1st time breastfeeding   Maternal Data Has patient been taught Hand Expression?: Yes Does the patient have breastfeeding experience prior to this delivery?: No  Feeding Feeding Type: Breast Fed  LATCH Score Latch: Too sleepy or reluctant, no latch achieved, no sucking elicited.  Audible Swallowing: None  Type of Nipple: Flat  Comfort (Breast/Nipple): Filling, red/small blisters or bruises, mild/mod discomfort(edematous breast)  Hold (Positioning): Full assist, staff holds infant at breast  LATCH Score: 2  Interventions Interventions: Breast  feeding basics reviewed;Support pillows;Assisted with latch;Position options;Skin to skin;Breast massage;Hand express;Shells;Pre-pump if needed;Reverse pressure;Hand pump;Breast compression;DEBP;Adjust position  Lactation Tools Discussed/Used Tools: Shells;Pump;Nipple Shields Nipple shield size: 20;24(24 to lg for baby) Shell Type: Inverted Breast pump type: Double-Electric Breast Pump;Manual WIC Program: Yes Pump Review: Setup, frequency, and cleaning;Milk Storage(mom states she is tired wants set up later) Initiated by:: RN(LC DEBP) Date initiated:: 02/02/18   Consult Status Consult Status: Follow-up Date: 02/02/18 Follow-up type: In-patient    [name], Diamond NickelLAURA G 02/02/2018, 3:15 AM

## 2018-02-02 NOTE — Anesthesia Postprocedure Evaluation (Signed)
Anesthesia Post Note  Patient: Kristin Mcdaniel  Procedure(s) Performed: AN AD HOC LABOR EPIDURAL     Patient location during evaluation: Mother Baby Anesthesia Type: Epidural Level of consciousness: awake Pain management: pain level controlled Vital Signs Assessment: post-procedure vital signs reviewed and stable Respiratory status: spontaneous breathing Cardiovascular status: stable Postop Assessment: patient able to bend at knees, epidural receding, no backache and no headache Anesthetic complications: no    Last Vitals:  Vitals:   02/02/18 0205 02/02/18 0315  BP: 132/68 136/68  Pulse: 75 75  Resp: 18 18  Temp: 36.9 C 37 C  SpO2:      Last Pain:  Vitals:   02/02/18 0315  TempSrc: Oral  PainSc: 3    Pain Goal:                 Edison PaceWILKERSON,Marquis Down

## 2018-02-02 NOTE — Progress Notes (Signed)
Late Entry Saw patient about 2330 (Epic was down)  Has been pushing 3 hours with little descent past  +1 station Some caput but little molding FHR stable and reassuring  AVSS  Patient pushing with good effort  Dr Debroah LoopArnold consulted for possible instrumented delivery He believes it is best to offer a vacuum assisted vaginal delivery  Interpretor used via phone Risks and benefits of procedure reviewed in detail by Dr Debroah LoopArnold with patient and her husband They agree to proceed with vacuum assistance  Aviva SignsWilliams, Tylan Briguglio L, CNM

## 2018-02-03 ENCOUNTER — Encounter: Payer: 59 | Admitting: Certified Nurse Midwife

## 2018-02-03 ENCOUNTER — Telehealth: Payer: Self-pay | Admitting: *Deleted

## 2018-02-03 ENCOUNTER — Ambulatory Visit: Payer: Self-pay

## 2018-02-03 MED ORDER — IBUPROFEN 600 MG PO TABS: 600.0000 mg | ORAL_TABLET | Freq: Four times a day (QID) | ORAL | 0 refills | Status: DC

## 2018-02-03 NOTE — Lactation Note (Signed)
This note was copied from a baby's chart. Lactation Consultation Note  Patient Name: Kristin Mcdaniel WUJWJ'XToday's Date: 02/03/2018 Reason for consult: Follow-up assessment;Primapara;1st time breastfeeding;Infant weight loss;Term  3938 hours old FT female who is still being exclusively BF by her mother, she's a P1 and chose to BF and formula feed upon admission. Per mom she's now pumping and getting some drops, and dad has been finger feeding baby that EBM. LC had to come back to the room twice because the first time baby was having her pictures taken.  Mom has pumped once today and she's also wearing her breastshells, [facility]. Parent's main concern is that baby will not suck, she just sits at the breast with her mouth open, whether she's wide awake or sleepy; baby is at 6% weight loss. Offered assistance with latch and mom agreed to have baby STS to feed.  First, tried the cross cradle position with a NS#20 but baby would not suck, she had the nipple just sitting in her mouth, no sucking elicit even with suck training. Then asked mom to try football, LC tried suck training again and this time baby was able to latch for 7 minutes, parents were very pleased, but disappointed when at the end of the feeding no colostrum was observed in NS, baby just did comfort sucking, no audible swallows heard either.  Feeding plan:  1. Encouraged mom to keep attempting feeding baby with  NS # 20 STS 8-12 times/24 hours or sooner if feeding cues are present 2. Mom will continue double pumping after feedings/attempts every 3 hours and and will feed baby any amount of EBM she may get. 3. Parents will try the football position and do suck training prior latching, but if baby is still not taking the breast by tonight, mom is aware that she can request formula to her RN  Parents will keep charting all attempts in baby's feeding diary and will call for assistance when needed. They reported  all questions and concerns were answered, they're both aware of LC services and will call PRN.  Maternal Data    Feeding Feeding Type: Breast Fed  LATCH Score Latch: Repeated attempts needed to sustain latch, nipple held in mouth throughout feeding, stimulation needed to elicit sucking reflex.(with NS # 20)  Audible Swallowing: None  Type of Nipple: Everted at rest and after stimulation(short shafted but everted on left breast)  Comfort (Breast/Nipple): Soft / non-tender(no pain or discomfort per mom)  Hold (Positioning): Assistance needed to correctly position infant at breast and maintain latch.  LATCH Score: 6  Interventions Interventions: Breast feeding basics reviewed;Assisted with latch;Skin to skin;Breast massage;Breast compression;Adjust position;Position options;Support pillows  Lactation Tools Discussed/Used Tools: Nipple Shields Nipple shield size: 20   Consult Status Consult Status: Follow-up Date: 02/04/18 Follow-up type: In-patient    Danine Hor Venetia ConstableS Doyl Bitting 02/03/2018, 1:46 PM

## 2018-02-03 NOTE — Discharge Instructions (Signed)
Postpartum Care After Vaginal Delivery °The period of time right after you deliver your newborn is called the postpartum period. °What kind of medical care will I receive? °· You may continue to receive fluids and medicines through an IV tube inserted into one of your veins. °· If an incision was made near your vagina (episiotomy) or if you had some vaginal tearing during delivery, cold compresses may be placed on your episiotomy or your tear. This helps to reduce pain and swelling. °· You may be given a squirt bottle to use when you go to the bathroom. You may use this until you are comfortable wiping as usual. To use the squirt bottle, follow these steps: °? Before you urinate, fill the squirt bottle with warm water. Do not use hot water. °? After you urinate, while you are sitting on the toilet, use the squirt bottle to rinse the area around your urethra and vaginal opening. This rinses away any urine and blood. °? You may do this instead of wiping. As you start healing, you may use the squirt bottle before wiping yourself. Make sure to wipe gently. °? Fill the squirt bottle with clean water every time you use the bathroom. °· You will be given sanitary pads to wear. °How can I expect to feel? °· You may not feel the need to urinate for several hours after delivery. °· You will have some soreness and pain in your abdomen and vagina. °· If you are breastfeeding, you may have uterine contractions every time you breastfeed for up to several weeks postpartum. Uterine contractions help your uterus return to its normal size. °· It is normal to have vaginal bleeding (lochia) after delivery. The amount and appearance of lochia is often similar to a menstrual period in the first week after delivery. It will gradually decrease over the next few weeks to a dry, yellow-brown discharge. For most women, lochia stops completely by 6-8 weeks after delivery. Vaginal bleeding can vary from woman to woman. °· Within the first few  days after delivery, you may have breast engorgement. This is when your breasts feel heavy, full, and uncomfortable. Your breasts may also throb and feel hard, tightly stretched, warm, and tender. After this occurs, you may have milk leaking from your breasts. Your health care provider can help you relieve discomfort due to breast engorgement. Breast engorgement should go away within a few days. °· You may feel more sad or worried than normal due to hormonal changes after delivery. These feelings should not last more than a few days. If these feelings do not go away after several days, speak with your health care provider. °How should I care for myself? °· Tell your health care provider if you have pain or discomfort. °· Drink enough water to keep your urine clear or pale yellow. °· Wash your hands thoroughly with soap and water for at least 20 seconds after changing your sanitary pads, after using the toilet, and before holding or feeding your baby. °· If you are not breastfeeding, avoid touching your breasts a lot. Doing this can make your breasts produce more milk. °· If you become weak or lightheaded, or you feel like you might faint, ask for help before: °? Getting out of bed. °? Showering. °· Change your sanitary pads frequently. Watch for any changes in your flow, such as a sudden increase in volume, a change in color, the passing of large blood clots. If you pass a blood clot from your vagina, save it   to show to your health care provider. Do not flush blood clots down the toilet without having your health care provider look at them. °· Make sure that all your vaccinations are up to date. This can help protect you and your baby from getting certain diseases. You may need to have immunizations done before you leave the hospital. °· If desired, talk with your health care provider about methods of family planning or birth control (contraception). °How can I start bonding with my baby? °Spending as much time as  possible with your baby is very important. During this time, you and your baby can get to know each other and develop a bond. Having your baby stay with you in your room (rooming in) can give you time to get to know your baby. Rooming in can also help you become comfortable caring for your baby. Breastfeeding can also help you bond with your baby. °How can I plan for returning home with my baby? °· Make sure that you have a car seat installed in your vehicle. °? Your car seat should be checked by a certified car seat installer to make sure that it is installed safely. °? Make sure that your baby fits into the car seat safely. °· Ask your health care provider any questions you have about caring for yourself or your baby. Make sure that you are able to contact your health care provider with any questions after leaving the hospital. °This information is not intended to replace advice given to you by your health care provider. Make sure you discuss any questions you have with your health care provider. °Document Released: 12/13/2006 Document Revised: 07/21/2015 Document Reviewed: 01/20/2015 °Elsevier Interactive Patient Education © 2018 [employer] Inc[employer] ° °

## 2018-02-03 NOTE — Discharge Summary (Addendum)
Postpartum Discharge Summary     Patient Name: Kristin Mcdaniel DOB: [birth year] MRN: [id number]  Date of admission: 01/31/2018 Delivering Provider: Adam Phenix   Date of discharge: 02/03/2018  Admitting diagnosis: 39 WKS, MONITORING Intrauterine pregnancy: [redacted]w[redacted]d     Secondary diagnosis:  Active Problems:   Normal labor  Additional problems: elevated BP intrapartum, VAVD     Discharge diagnosis: Term Pregnancy Delivered                                                                                                Post partum procedures:none  Augmentation: AROM and Pitocin  Complications: None  Hospital course:  Onset of Labor With Vaginal Delivery     22 y.o. yo G1P1001 at [redacted]w[redacted]d was admitted in Latent Labor on 01/31/2018. Patient had an uncomplicated labor course as follows:  Membrane Rupture Time/Date: 12:53 PM ,02/01/2018   Intrapartum Procedures: Episiotomy: None [1]                                         Lacerations:  3rd degree [4]  Patient had a delivery of a Viable infant. 02/01/2018  Information for the patient's newborn:  Korianna, Washer Girl Ellamarie [[id number]]  Delivery Method: Vag-Vacuum(Filed from delivery)    Pateint had an uncomplicated postpartum course.  She is ambulating, tolerating a regular diet, passing flatus, and urinating well. Patient is discharged home in stable condition on 02/03/18.   Magnesium Sulfate recieved: No BMZ received: No  Physical exam  Vitals:   02/02/18 1809 02/02/18 2011 02/02/18 2330 02/03/18 0559  BP: (!) 144/87 133/79 126/78 129/80  Pulse:   81 80  Resp: 20  18 18   Temp: 98.9 F (37.2 C)  98 F (36.7 C) 97.8 F (36.6 C)  TempSrc: Oral  Oral Oral  SpO2:    100%  Weight:      Height:       General: alert, cooperative and no distress Lochia: appropriate Uterine Fundus: firm Incision: N/A DVT Evaluation: No evidence of DVT seen on physical exam. Labs: Lab Results  Component Value Date   WBC 18.0 (H) 02/02/2018   HGB  10.9 (L) 02/02/2018   HCT 32.0 (L) 02/02/2018   MCV 90.9 02/02/2018   PLT 171 02/02/2018   CMP Latest Ref Rng & Units 01/31/2018  Glucose 70 - 99 mg/dL 75  BUN 6 - 20 mg/dL 9  Creatinine 7.82 - 9.56 mg/dL 2.13  Sodium 086 - 578 mmol/L 136  Potassium 3.5 - 5.1 mmol/L 3.6  Chloride 98 - 111 mmol/L 108  CO2 22 - 32 mmol/L 19(L)  Calcium 8.9 - 10.3 mg/dL 9.7  Total Protein 6.5 - 8.1 g/dL 6.8  Total Bilirubin 0.3 - 1.2 mg/dL 0.5  Alkaline Phos 38 - 126 U/L 217(H)  AST 15 - 41 U/L 16  ALT 0 - 44 U/L 13    Discharge instruction: per After Visit Summary and "Baby and Me Booklet".  After visit meds:  Allergies as of 02/03/2018  No Known Allergies     Medication List    STOP taking these medications   docusate sodium 100 MG capsule Commonly known as:  COLACE   polyethylene glycol powder powder Commonly known as:  GLYCOLAX/MIRALAX     TAKE these medications   ibuprofen 600 MG tablet Commonly known as:  ADVIL,MOTRIN Take 1 tablet (600 mg total) by mouth every 6 (six) hours.   multivitamin-prenatal 27-0.8 MG Tabs tablet Take 1 tablet by mouth daily at 12 noon.       Diet: routine diet  Activity: Advance as tolerated. Pelvic rest for 6 weeks.   Outpatient follow up:4 weeks Follow up Appt: Future Appointments  Date Time Provider Department Center  05/15/2018  9:50 AM Sunnie NielsenAlexander, Natalie, DO [facility] None   Follow up Visit:   Please schedule this patient for Postpartum visit in: 4 weeks with the following provider: Any provider For C/S patients schedule nurse incision check in weeks 2 weeks: no Low risk pregnancy complicated by: HTN Delivery mode:  Vacuum Anticipated Birth Control:  POPs PP Procedures needed: BP check  Schedule Integrated BH visit: no      Newborn Data: Live born female  Birth Weight: 7 lb 10.4 oz (3470 g) APGAR: 6, 9  Newborn Delivery   Birth date/time:  02/01/2018 23:42:00 Delivery type:  Vaginal, Vacuum (Extractor)     Baby Feeding:  Breast Disposition:home with mother   02/03/2018 Leeroy Bockhelsey L Anderson, DO  OB FELLOW DISCHARGE ATTESTATION  I have seen and examined this patient and agree with above documentation in the resident's note.   Gwenevere AbbotNimeka Willamina Grieshop, MD OB Fellow  02/03/2018, 3:16 PM

## 2018-02-03 NOTE — Progress Notes (Addendum)
Post Partum Day 1 Subjective: no complaints, up ad lib, voiding, tolerating PO and + flatus. Patient and father were concerned about infant decreasing weight since birth and not getting enough to eat. I discussed this with them and will continue to monitor.   Objective: Blood pressure 129/80, pulse 80, temperature 97.8 F (36.6 C), temperature source Oral, resp. rate 18, height 5' (1.524 m), weight 80.3 kg, last menstrual period 05/02/2017, SpO2 100 %, unknown if currently breastfeeding.  Physical Exam:  General: alert, cooperative, appears stated age and no distress Lochia: appropriate Uterine Fundus: firm Incision: NA DVT Evaluation: No evidence of DVT seen on physical exam.  Recent Labs    02/01/18 0749 02/02/18 0606  HGB 12.3 10.9*  HCT 35.9* 32.0*    Assessment/Plan: Plan for discharge tomorrow and Breastfeeding   LOS: 3 days   Chelsey L Anderson 02/03/2018, 6:16 AM   OB FELLOW POSTPARTUM PROGRESS NOTE ATTESTATION  I have seen and examined this patient and agree with above documentation in the resident's note.   Gwenevere AbbotNimeka Rylea Selway, MD OB Fellow  02/05/2018, 4:48 PM

## 2018-02-06 NOTE — Telephone Encounter (Signed)
Spoke with partner [name] to schedule patient's appointments. He was calling back as I had already started this note.

## 2018-02-07 ENCOUNTER — Encounter: Payer: 59 | Admitting: Advanced Practice Midwife

## 2018-02-08 ENCOUNTER — Ambulatory Visit: Payer: 59 | Admitting: *Deleted

## 2018-02-08 ENCOUNTER — Encounter: Payer: Self-pay | Admitting: *Deleted

## 2018-02-08 VITALS — BP 130/84 | HR 74

## 2018-02-08 DIAGNOSIS — O169 Unspecified maternal hypertension, unspecified trimester: Secondary | ICD-10-CM

## 2018-02-08 NOTE — Progress Notes (Signed)
Pt here for BP check only 1 week post delivery.  She was admitted last week for IOL due to hypertension.  BP today is 130/84 P-74.  Pt denies any visual changes or headaches.  Feet and ankles do not appear to have any swelling.  Pt is breast feeding.  Pt's husband made aware that if pt starts to have any changes in vision or h/a that will not go away to call the office.  Holidays hours and after hours info given to pt.  We will see pt 03/02/18 for post partum visit or sooner if needed.

## 2018-03-02 ENCOUNTER — Encounter: Payer: Self-pay | Admitting: Obstetrics & Gynecology

## 2018-03-02 ENCOUNTER — Ambulatory Visit (INDEPENDENT_AMBULATORY_CARE_PROVIDER_SITE_OTHER): Payer: 59 | Admitting: Obstetrics & Gynecology

## 2018-03-02 VITALS — BP 124/71 | HR 68 | Wt 153.0 lb

## 2018-03-02 DIAGNOSIS — Z3202 Encounter for pregnancy test, result negative: Secondary | ICD-10-CM | POA: Diagnosis not present

## 2018-03-02 DIAGNOSIS — N760 Acute vaginitis: Secondary | ICD-10-CM

## 2018-03-02 DIAGNOSIS — Z30017 Encounter for initial prescription of implantable subdermal contraceptive: Secondary | ICD-10-CM | POA: Diagnosis not present

## 2018-03-02 DIAGNOSIS — B9689 Other specified bacterial agents as the cause of diseases classified elsewhere: Secondary | ICD-10-CM

## 2018-03-02 DIAGNOSIS — R102 Pelvic and perineal pain: Secondary | ICD-10-CM

## 2018-03-02 DIAGNOSIS — Z113 Encounter for screening for infections with a predominantly sexual mode of transmission: Secondary | ICD-10-CM | POA: Diagnosis not present

## 2018-03-02 DIAGNOSIS — N898 Other specified noninflammatory disorders of vagina: Secondary | ICD-10-CM

## 2018-03-02 LAB — POCT URINE PREGNANCY: PREG TEST UR: NEGATIVE

## 2018-03-02 MED ORDER — ETONOGESTREL 68 MG ~~LOC~~ IMPL
68.0000 mg | DRUG_IMPLANT | Freq: Once | SUBCUTANEOUS | Status: AC
  Administered 2018-03-02: 68 mg via SUBCUTANEOUS

## 2018-03-02 NOTE — Progress Notes (Signed)
Post Partum Exam  Kristin Mcdaniel is a 23 y.o. 121P1001 female who presents for a postpartum visit. She is 4 weeks postpartum following a low forceps vaginal delivery with 3rd degree vaginal laceration. I have fully reviewed the prenatal and intrapartum course. The delivery was at 9870w3d gestational weeks.  Anesthesia: epidural. Postpartum course has been unremarkable. Baby's course has been unremarkable. Baby is feeding by bottle Rush Barer- Gerber. Bleeding no bleeding. Bowel function is normal. Bladder function is normal. Patient is not sexually active. Contraception method is undecided (possibly Nexplanon). Postpartum depression screening:neg  The following portions of the patient's history were reviewed and updated as appropriate: allergies, current medications, past family history, past medical history, past social history, past surgical history and problem list.   Review of Systems Pertinent items are noted in HPI.    Objective:  Last menstrual period 05/02/2017, currently breastfeeding.  General:  alert, cooperative and no distress   Breasts:  negative findings: skin normal  Lungs: clear to auscultation bilaterally  Heart:  regular rate and rhythm  Abdomen: soft, non-tender; bowel sounds normal; no masses,  no organomegaly   Vulva:  normal  Vagina: pain in vagina over 3rd degree laceration, yellowish disrage  Cervix:  no lesions  Corpus: normal  Adnexa:  not evaluated  Rectal Exam: normal sphincter        Assessment:     postpartum exam with vaginal pain   Plan:   1. Contraception: Nexplanon 2. Pelvic PT 3. Follow up in: 6 weeks or as needed.  4. Cultures of discharge.  UPT was negative. Consent was signed. Time out procedure was performed.  Her left arm was prepped with betadine and infiltrated with 3 cc of 1% lidocaine. After adequate anesthesia was assured, the Nexplanon device was placed between the biceps and triceps approximately 8-10 cm superior to the humeral medial  epicondyle.  The Nexplanon was placed superficially by tenting the skin.  Neplanon was palpated in the correct position by myself and patient.  Her arm was hemostatic and was bandaged. Patient tolerated the procedure well and was given appropriate discharge instructions.

## 2018-03-02 NOTE — Addendum Note (Signed)
Addended by: Kathie Dike on: 03/02/2018 04:04 PM   Modules accepted: Orders

## 2018-03-03 LAB — CERVICOVAGINAL ANCILLARY ONLY
Bacterial vaginitis: POSITIVE — AB
Candida vaginitis: NEGATIVE
Chlamydia: NEGATIVE
Neisseria Gonorrhea: NEGATIVE
Trichomonas: NEGATIVE

## 2018-03-04 ENCOUNTER — Other Ambulatory Visit: Payer: Self-pay | Admitting: Obstetrics & Gynecology

## 2018-03-06 ENCOUNTER — Telehealth: Payer: Self-pay | Admitting: *Deleted

## 2018-03-06 MED ORDER — METRONIDAZOLE 500 MG PO TABS: 500.0000 mg | ORAL_TABLET | Freq: Two times a day (BID) | ORAL | 0 refills | Status: DC

## 2018-03-06 NOTE — Telephone Encounter (Signed)
-----   Message from Lesly DukesKelly H Leggett, MD sent at 03/04/2018  1:19 PM EST ----- BV on wet prep.  Rx with Flagyl per protocol.  Pt does not have my chart and will need to be called.

## 2018-03-06 NOTE — Telephone Encounter (Signed)
Pt's husband notified of test results and that a RX was sent to [facility] [place] for [place].  Husband voices understanding.

## 2018-03-28 ENCOUNTER — Other Ambulatory Visit: Payer: Self-pay

## 2018-03-28 ENCOUNTER — Ambulatory Visit: Payer: 59 | Attending: Obstetrics & Gynecology | Admitting: Physical Therapy

## 2018-03-28 DIAGNOSIS — R279 Unspecified lack of coordination: Secondary | ICD-10-CM | POA: Insufficient documentation

## 2018-03-28 DIAGNOSIS — M6281 Muscle weakness (generalized): Secondary | ICD-10-CM

## 2018-03-29 ENCOUNTER — Encounter: Payer: Self-pay | Admitting: Physical Therapy

## 2018-03-29 NOTE — Therapy (Signed)
[facility] [address] [place], [place], [address] Phone: [phone]   Fax:  [phone]  Physical Therapy Evaluation  Patient Details  Name: Kristin Mcdaniel MRN: [id number] Date of Birth: 12/27/1995 Referring Provider (PT): Lesly Dukes, MD   Encounter Date: 03/28/2018  PT End of Session - 03/29/18 1530    Visit Number  1    Date for PT Re-Evaluation  05/23/18    PT Start Time  1532    PT Stop Time  1611    PT Time Calculation (min)  39 min    Behavior During Therapy  [facility] for tasks assessed/performed       Past Medical History:  Diagnosis Date  . Medical history non-contributory     Past Surgical History:  Procedure Laterality Date  . NO PAST SURGERIES      There were no vitals filed for this visit.   Subjective Assessment - 03/28/18 1542    Subjective  Pt states she feels pressure when standing after going to the bathroom when standing up.  She reports it has gotten better.    Patient is accompained by:  Interpreter    Pertinent History  forceps delivery    Limitations  Other (comment)   toileting   Patient Stated Goals  not feel the pressure    Currently in Pain?  No/denies         [facility] PT Assessment - 03/29/18 0001      Assessment   Medical Diagnosis  R10.2 (ICD-10-CM) - Pelvic pain    Referring Provider (PT)  Lesly Dukes, MD    Onset Date/Surgical Date  01/31/18    Prior Therapy  No      Precautions   Precautions  None      Restrictions   Weight Bearing Restrictions  No      Balance Screen   Has the patient fallen in the past 6 months  No      Home Environment   Living Environment  Private residence    Living Arrangements  Spouse/significant other;Children;Other relatives   sister and 43month old baby     Prior Function   Level of Independence  Independent      Cognition   Overall Cognitive Status  Within Functional Limits for tasks assessed      Posture/Postural  Control   Posture/Postural Control  Postural limitations    Postural Limitations  Rounded Shoulders;Anterior pelvic tilt      Strength   Overall Strength Comments  hip flexion, abduction, adduction MMT 4/5      Flexibility   Soft Tissue Assessment /Muscle Length  yes    Hamstrings  75%      Palpation   Palpation comment  one finger diastasis rectus abdominus                Objective measurements completed on examination: See above findings.    Pelvic Floor Special Questions - 03/29/18 0001    Prior Pelvic/Prostate Exam  Yes    Are you Pregnant or attempting pregnancy?  No    Prior Pregnancies  Yes    Number of Pregnancies  1    Number of Vaginal Deliveries  1    Any difficulty with labor and deliveries  Yes   foreceps and 3 degree tear   Currently Sexually Active  No    Urinary Leakage  No    Urinary urgency  Yes   same since I was young  Fecal incontinence  No    Falling out feeling (prolapse)  Yes    Perineal Body/Introitus   Descended    Prolapse  Anterior Wall   possible   Pelvic Floor Internal Exam  identity confirmed and pt was informed of external soft tissue assessment and patient consents to assessment today    Palpation  TTP ischhiocavernosis       [facility] Adult PT Treatment/Exercise - 03/29/18 0001      Self-Care   Self-Care  Other Self-Care Comments    Other Self-Care Comments   educated initial HEP: prone kegel, supine kegel in hooklying, TrA activation with marching, quadruped kegel and TrA with forward reach               PT Short Term Goals - 03/29/18 1559      PT SHORT TERM GOAL #1   Title  pt will be able to correctly perform initial HEP    Time  2    Period  Weeks    Status  New    Target Date  04/11/18        PT Long Term Goals - 03/29/18 1556      PT LONG TERM GOAL #1   Title  Pt will report 50% reduced feeling of pressure in the vagina after toileting    Time  8    Period  Weeks    Status  New    Target Date   05/23/18      PT LONG TERM GOAL #2   Title  pt will be independent with HEP for core and pelvic floor strengthening    Time  8    Period  Weeks    Status  New    Target Date  05/23/18      PT LONG TERM GOAL #3   Title  Pt will have no diastasis of rectus abdominis    Time  8    Period  Weeks    Status  New    Target Date  05/23/18             Plan - 03/29/18 1551    Clinical Impression Statement    History and Personal Factors relevant to plan of care:  forceps delivery with 3rd degree trearing    Clinical Presentation  Stable  Pt presented to clinic due to feeling pressure in her vagina when getting up after using the bathroom.  Pt denies using force or bearing down when toileting.  Pt has some bilateral hip and abdominal weakness with diastasis of rectus abdominus of one finger width.  PT deferred internal assessment due to pt still feeling stitches present and she is able to perform lift of pelvic floor muscles as palpated externally.  Pt has hamstring and lumbar tightness causing some anterior pelvic tilt . Pt will benefit from skilled PT to address posture with core and pelvic floor strength in order to reduce feeling of prolapse and reduce risk of future prolapse.   Clinical Presentation due to:  pt is stable    Clinical Decision Making  Moderate    Rehab Potential  Excellent    PT Frequency  1x / week    PT Duration  8 weeks    PT Treatment/Interventions  ADLs/Self Care Home Management;Biofeedback;Functional mobility training;Therapeutic activities;Therapeutic exercise;Neuromuscular re-education;Patient/family education;Manual techniques;Taping;Dry needling;Passive range of motion    PT Next Visit Plan  review and print out HEP, assess pelvic floor internally as needed if no improvement, biofeedback as needed,  lumbar fascial release    PT Home Exercise Plan  unable to provide handout due to internet down    Consulted and Agree with Plan of Care  Patient       Patient  will benefit from skilled therapeutic intervention in order to improve the following deficits and impairments:  Increased muscle spasms, Increased fascial restricitons, Pain, Postural dysfunction, Decreased strength, Decreased coordination  Visit Diagnosis: Muscle weakness (generalized) - Plan: PT plan of care cert/re-cert  Unspecified lack of coordination - Plan: PT plan of care cert/re-cert     Problem List Patient Active Problem List   Diagnosis Date Noted  . Benign heart murmur 05/10/2016    Vincente PoliJakki Crosser, PT 03/29/2018, 4:16 PM  [facility] [address] [place], [place], [address] Phone: [phone]   Fax:  [phone]  Name: Kristin Mcdaniel MRN: [id number] Date of Birth: [birth year]

## 2018-04-05 ENCOUNTER — Ambulatory Visit: Payer: 59 | Attending: Obstetrics & Gynecology | Admitting: Physical Therapy

## 2018-04-05 DIAGNOSIS — M6281 Muscle weakness (generalized): Secondary | ICD-10-CM | POA: Diagnosis not present

## 2018-04-05 DIAGNOSIS — R279 Unspecified lack of coordination: Secondary | ICD-10-CM | POA: Insufficient documentation

## 2018-04-05 NOTE — Patient Instructions (Signed)
Access Code: [email or web address]  URL: [email or web address]  Date: 04/05/2018  Prepared by: Dorie Rank   Exercises  Supine Bridge with Pelvic Floor Contraction - 10 reps - 3 sets - 1x daily - 7x weekly  Quadruped Pelvic Floor Contraction with Opposite Arm and Leg Lift - 10 reps - 3 sets - 1x daily - 7x weekly  Sit to Stand with Pelvic Floor Contraction - 10 reps - 2 sets - 1x daily - 7x weekly  Supine Hamstring Stretch - 3 reps - 1 sets - 30 sec hold - 1x daily - 7x weekly  Supine Butterfly Groin Stretch - 3 reps - 1 sets - 30 sec hold - 1x daily - 7x weekly  Seated Piriformis Stretch with Trunk Bend - 3 reps - 1 sets - 30 sec hold - 1x daily - 7x weekly

## 2018-04-05 NOTE — Therapy (Addendum)
[facility] [address], [place], [place], [address] Phone: [phone]   Fax:  [phone]  Physical Therapy Treatment  Patient Details  Name: Kristin Mcdaniel MRN: [id number] Date of Birth: 28-Nov-1995 Referring Provider (PT): Guss Bunde, MD   Encounter Date: 04/05/2018  PT End of Session - 04/05/18 1405    Visit Number  2    Date for PT Re-Evaluation  05/23/18    PT Start Time  1404    PT Stop Time  1444    PT Time Calculation (min)  40 min    Activity Tolerance  Patient tolerated treatment well    Behavior During Therapy  [facility] for tasks assessed/performed       Past Medical History:  Diagnosis Date  . Medical history non-contributory     Past Surgical History:  Procedure Laterality Date  . NO PAST SURGERIES      There were no vitals filed for this visit.  Subjective Assessment - 04/05/18 1409    Subjective  Pt states she feels good today.  I have had a little pressure but not alot.     Patient Stated Goals  not feel the pressure    Currently in Pain?  No/denies                       [facility] Adult PT Treatment/Exercise - 04/05/18 0001      Self-Care   Other Self-Care Comments   educated on gentle scar massage to perineal body      Exercises   Exercises  Lumbar      Lumbar Exercises: Stretches   Active Hamstring Stretch  Right;Left;2 reps;20 seconds    Pelvic Tilt  10 reps    Figure 4 Stretch  2 reps;20 seconds    Other Lumbar Stretch Exercise  butterfly stretch - 2 x 30sec      Lumbar Exercises: Seated   Sit to Stand  5 reps      Lumbar Exercises: Supine   Clam  20 reps    Bent Knee Raise  20 reps;2 seconds   TrA contraction     Lumbar Exercises: Quadruped   Opposite Arm/Leg Raise  Right arm/Left leg;Left arm/Right leg;5 reps             PT Education - 04/05/18 1530    Education Details   Access Code: [email or web address]     Person(s) Educated  Patient    Methods   Explanation;Demonstration;Handout;Verbal cues;Tactile cues    Comprehension  Verbalized understanding;Returned demonstration       PT Short Term Goals - 04/05/18 1526      PT SHORT TERM GOAL #1   Title  pt will be able to correctly perform initial HEP    Status  Achieved        PT Long Term Goals - 03/29/18 1556      PT LONG TERM GOAL #1   Title  Pt will report 50% reduced feeling of pressure in the vagina after toileting    Time  8    Period  Weeks    Status  New    Target Date  05/23/18      PT LONG TERM GOAL #2   Title  pt will be independent with HEP for core and pelvic floor strengthening    Time  8    Period  Weeks    Status  New    Target Date  05/23/18  PT LONG TERM GOAL #3   Title  Pt will have no diastasis of rectus abdominis    Time  8    Period  Weeks    Status  New    Target Date  05/23/18            Plan - 04/05/18 1526    Clinical Impression Statement  Pt is doing well and reports only occasional and minimal pressure.  Pt was educated in gentle scar massage when able to ensure the scar tissue doesn't become tight.  Pt will continue to benefit from skilled PT to enseure she is able to progress core and pelvic floor strength and be successful with HEP.    PT Treatment/Interventions  ADLs/Self Care Home Management;Biofeedback;Functional mobility training;Therapeutic activities;Therapeutic exercise;Neuromuscular re-education;Patient/family education;Manual techniques;Taping;Dry needling;Passive range of motion    PT Next Visit Plan  stretch and relax with breathing standing exercises for pelvic floor, final HEP if still doing well    PT Home Exercise Plan   Access Code: [email or web address]     Consulted and Agree with Plan of Care  Patient       Patient will benefit from skilled therapeutic intervention in order to improve the following deficits and impairments:  Increased muscle spasms, Increased fascial restricitons, Pain, Postural dysfunction, Decreased  strength, Decreased coordination  Visit Diagnosis: Muscle weakness (generalized)  Unspecified lack of coordination     Problem List Patient Active Problem List   Diagnosis Date Noted  . Benign heart murmur 05/10/2016    Zannie [place], PT 04/05/2018, 3:31 PM  [facility] [address] [place], [place], [address] Phone: [phone]   Fax:  [phone]  Name: Kristin Mcdaniel MRN: [id number] Date of Birth: January 23, 1996  PHYSICAL THERAPY DISCHARGE SUMMARY  Visits from Start of Care: 2  Current functional level related to goals / functional outcomes: See above details   Remaining deficits: See above details   Education / Equipment: HEP  Plan: Patient agrees to discharge.  Patient goals were not met. Patient is being discharged due to not returning since the last visit.  ?????     [employer], PT 08/03/18 2:37 PM

## 2018-04-12 ENCOUNTER — Ambulatory Visit: Payer: 59 | Admitting: Physical Therapy

## 2018-04-17 ENCOUNTER — Encounter: Payer: Self-pay | Admitting: Obstetrics & Gynecology

## 2018-04-17 ENCOUNTER — Ambulatory Visit (INDEPENDENT_AMBULATORY_CARE_PROVIDER_SITE_OTHER): Payer: 59 | Admitting: Obstetrics & Gynecology

## 2018-04-17 DIAGNOSIS — Z3049 Encounter for surveillance of other contraceptives: Secondary | ICD-10-CM | POA: Diagnosis not present

## 2018-04-17 NOTE — Progress Notes (Signed)
   Subjective:    Patient ID: Kristin Mcdaniel, female    DOB: 19-Jul-1995, 23 y.o.   MRN: [id number]  HPI  23 year old female presents for follow-up evaluation.  Patient had Nexplanon placed has had some spotting but is happy with her Nexplanon.  Patient is 6 weeks post initiation of physical therapy.  She has her third visit this week.  Pain is much better.  He did try intercourse a little over week ago and it was painful.  Patient has no pain today.  Review of Systems  Constitutional: Negative.   Respiratory: Negative.   Cardiovascular: Negative.   Gastrointestinal: Negative.   Genitourinary: Positive for menstrual problem.       Objective:   Physical Exam Vitals signs reviewed.  Constitutional:      General: She is not in acute distress.    Appearance: She is well-developed.  HENT:     Head: Normocephalic and atraumatic.  Eyes:     Conjunctiva/sclera: Conjunctivae normal.  Cardiovascular:     Rate and Rhythm: Normal rate.  Pulmonary:     Effort: Pulmonary effort is normal.  Genitourinary:    General: Normal vulva.     Comments: Introitus is well-healed from third-degree laceration. Pain is markedly less.  There is some increased tension in her pelvic diaphragm.  However she is able to tolerate an exam without pain. Skin:    General: Skin is warm and dry.  Neurological:     Mental Status: She is alert and oriented to person, place, and time.    Assessment & Plan:  23 year old female contracepting well with Nexplanon and resolved vulvar pain status post obstetrical laceration.  1.  She will follow-up physical therapy as needed. 2.  Turn to clinic in 1 year.  15 mins spent face to face with patient with >15 minutes counseling.  Husband was interpreter.

## 2018-04-20 ENCOUNTER — Ambulatory Visit: Payer: 59 | Admitting: Physical Therapy

## 2018-04-26 ENCOUNTER — Ambulatory Visit: Payer: 59 | Admitting: Physical Therapy

## 2018-04-26 ENCOUNTER — Telehealth: Payer: Self-pay | Admitting: Physical Therapy

## 2018-04-26 NOTE — Telephone Encounter (Signed)
Patient did not show for appointment.  Patient was called by the interpreter and she left message to please call us back.  Vincente Poli, PT 04/26/18 3:55 PM

## 2018-05-15 ENCOUNTER — Encounter: Payer: 59 | Admitting: Osteopathic Medicine

## 2019-02-18 IMAGING — US US OB TRANSVAGINAL
1 series · 14 of 28 positions shown · non-contrast
Comparison: None.

CLINICAL DATA: Dating, viability

EXAM:
OBSTETRIC <14 WK US AND TRANSVAGINAL OB US
TECHNIQUE: Both transabdominal and transvaginal ultrasound examinations were
performed for complete evaluation of the gestation as well as the
maternal uterus, adnexal regions, and pelvic cul-de-sac.
Transvaginal technique was performed to assess early pregnancy.

[Series 1: us ob transvaginal · 0.20mm/px · 14 of 96 slices shown]
[im 4/96]
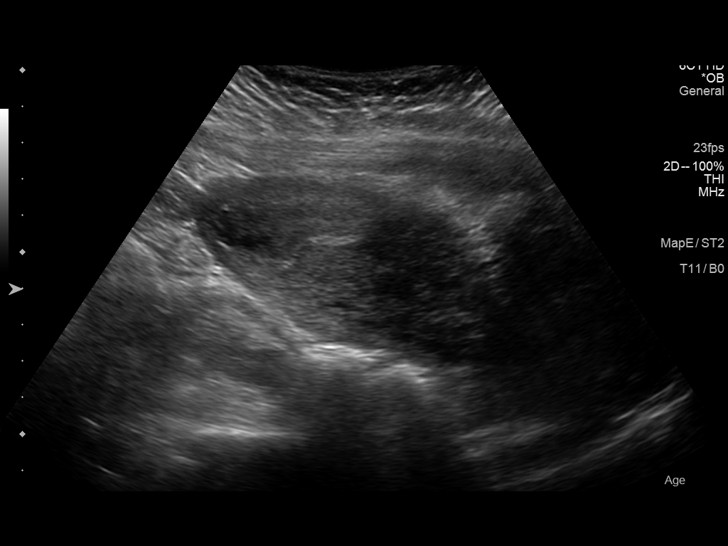
[im 11/96]
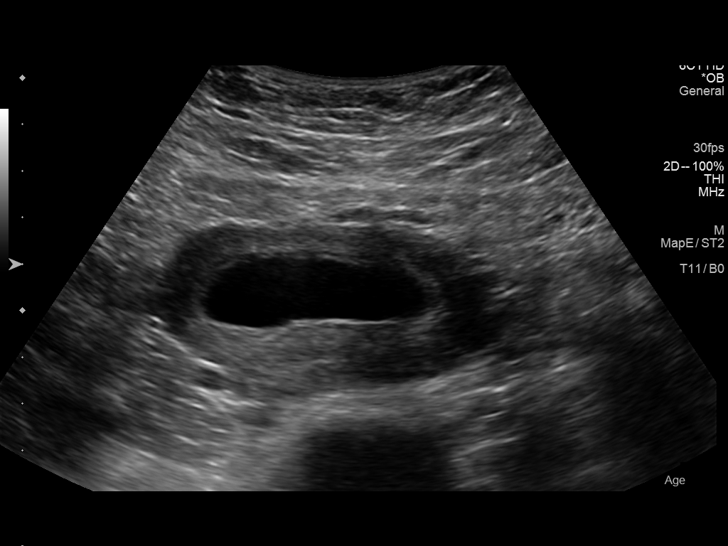
[im 18/96]
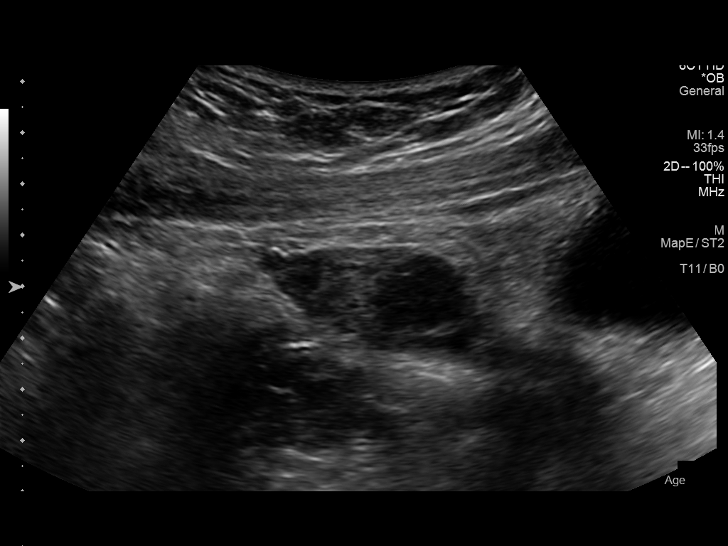
[im 25/96]
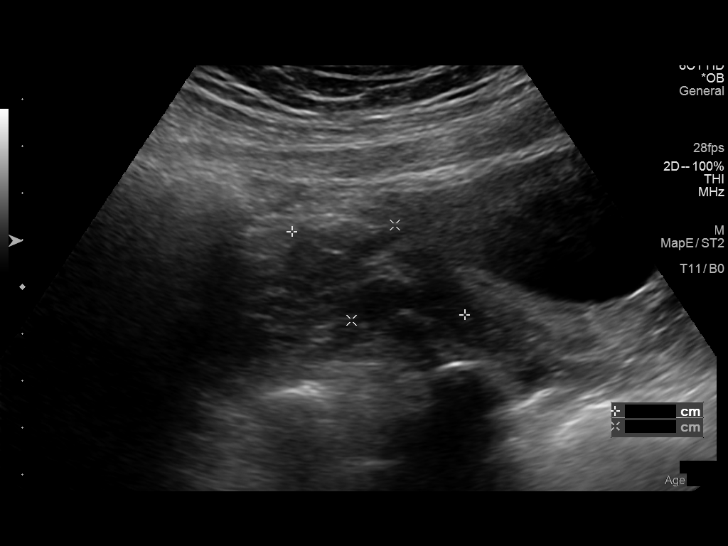
[im 32/96]
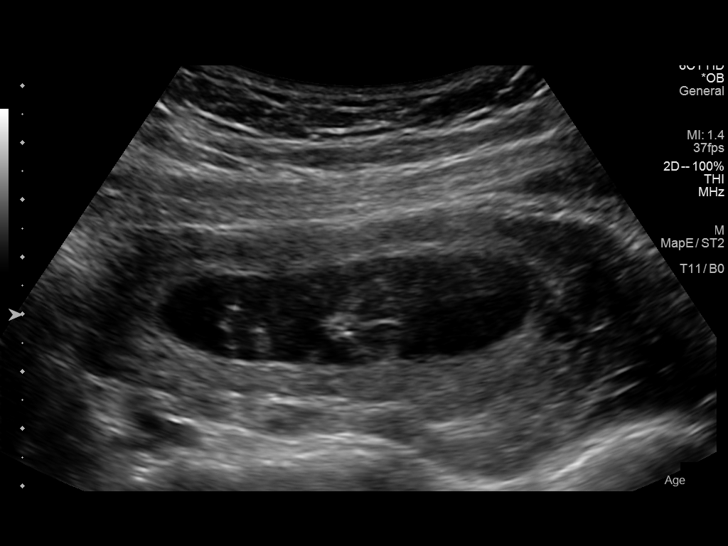
[im 39/96]
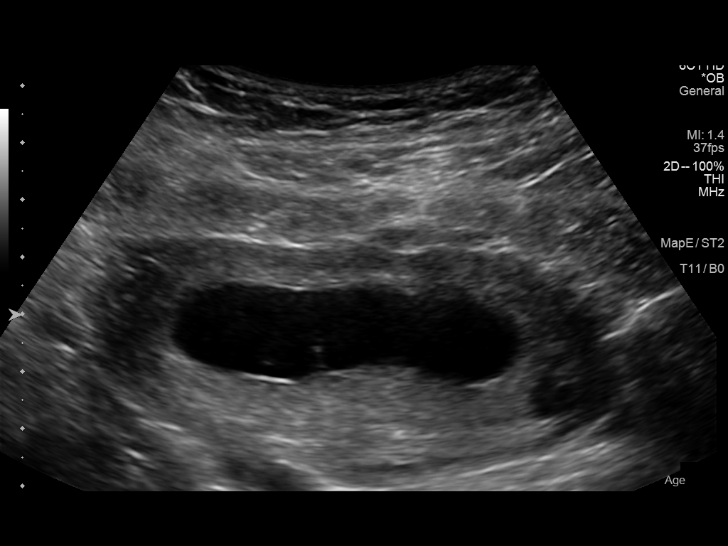
[im 46/96]
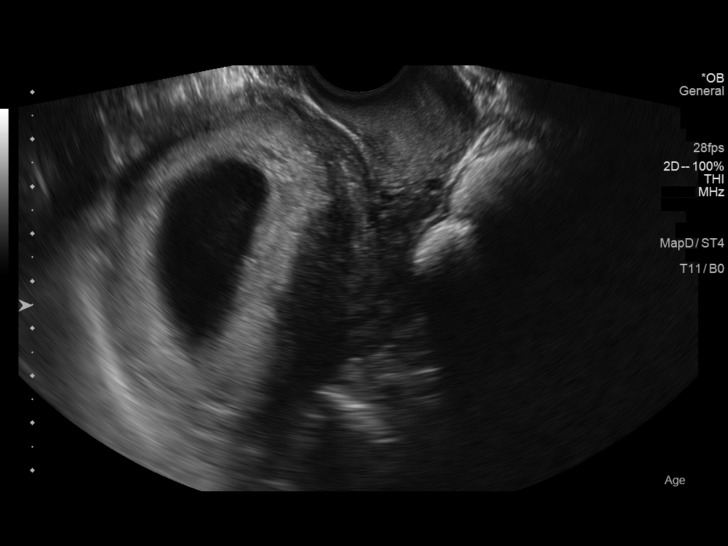
[im 53/96]
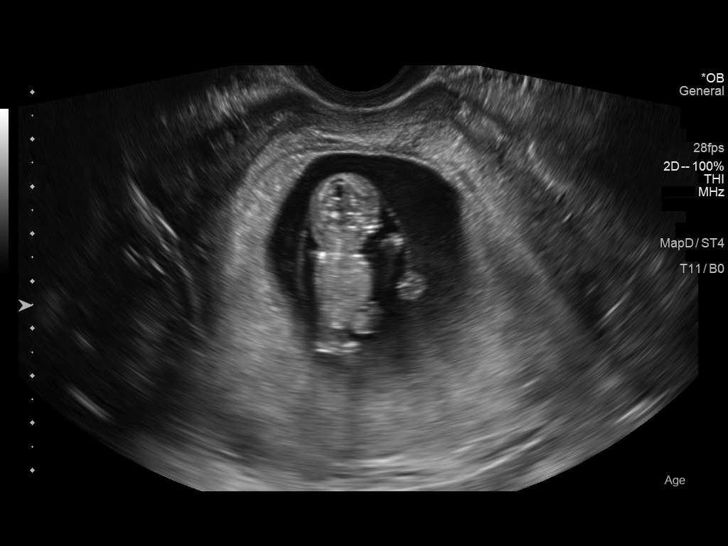
[im 60/96]
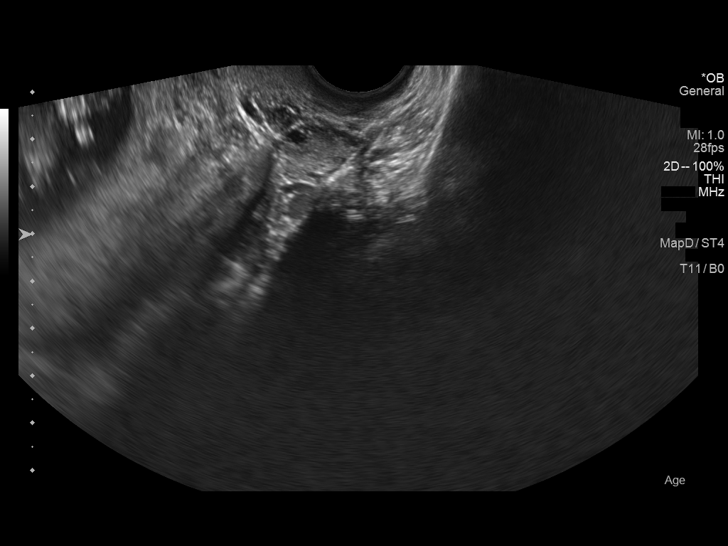
[im 67/96]
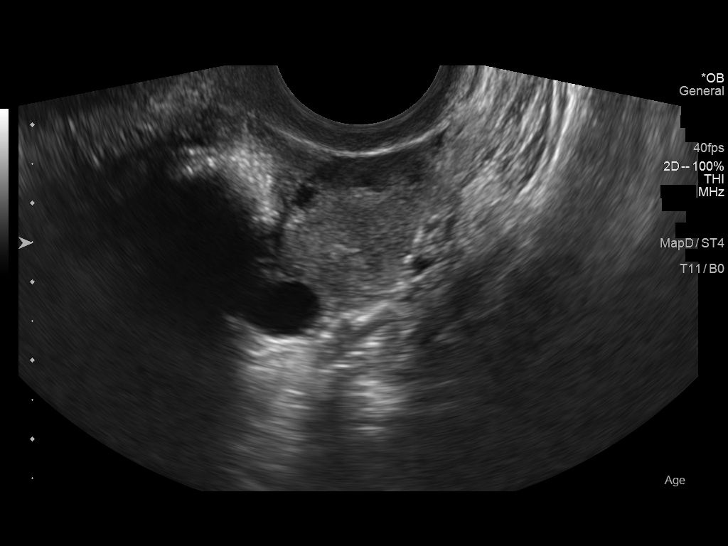
[im 74/96]
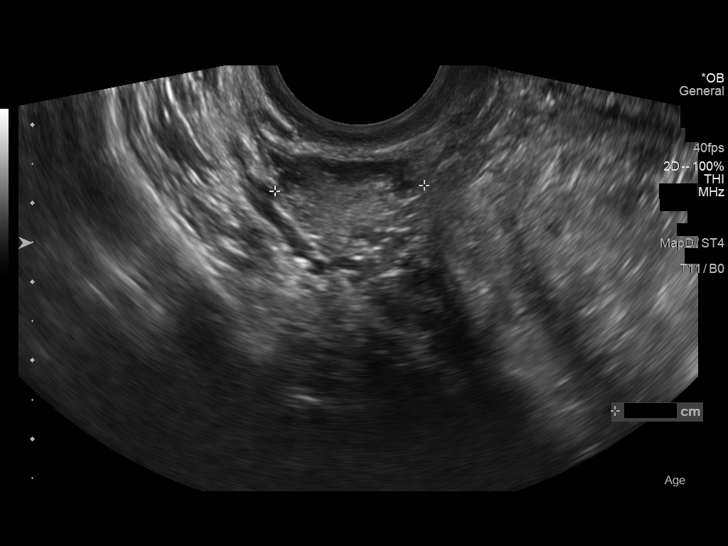
[im 81/96]
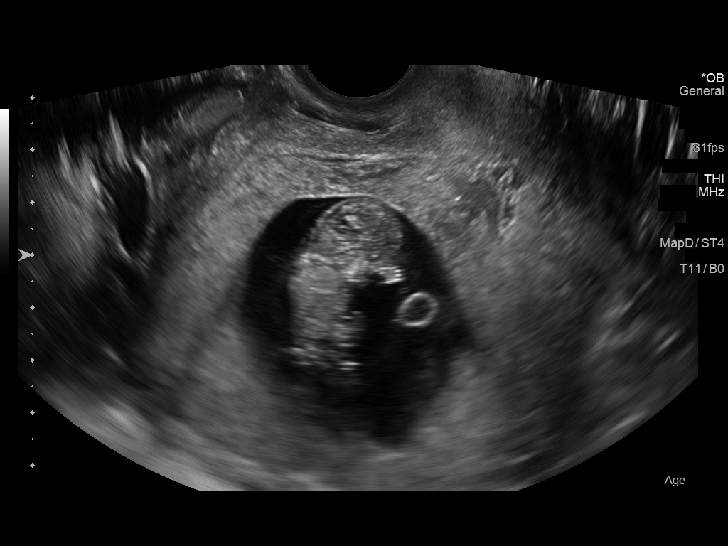
[im 88/96]
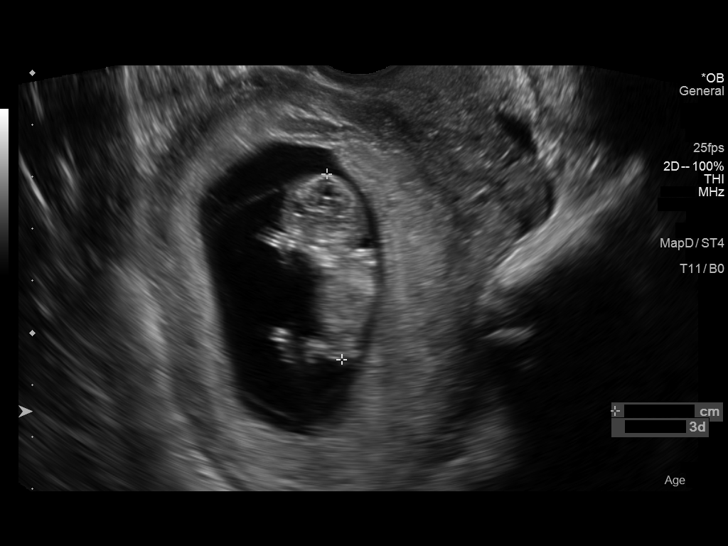
[im 96/96]
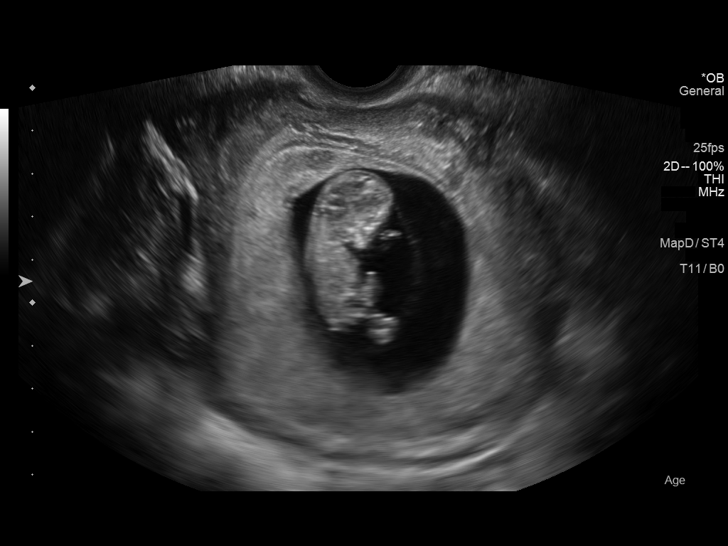

[14 of 28 positions shown; findings below may reference images not displayed]

FINDINGS: Intrauterine gestational sac: Single

Yolk sac:  Visualized

Embryo:  Visualized

Cardiac Activity: Visualized

Heart Rate: 171 bpm

MSD:   mm    w     d

CRL:  35 mm   10 w   2 d                  US EDC: 02/06/2018

Subchorionic hemorrhage:  Small subchorionic hemorrhage

Maternal uterus/adnexae: No adnexal mass or free fluid.
IMPRESSION: Ten week 2 day intrauterine pregnancy. Fetal heart rate 171 beats
per minute. Small subchorionic hemorrhage.

## 2019-04-20 IMAGING — US US MFM OB COMP +14 WKS
1 series · 14 of 28 positions shown · non-contrast
Comparison: none

[Series 1: us mfm ob comp +14 wks · 14 of 88 slices shown]
[im 4/88]
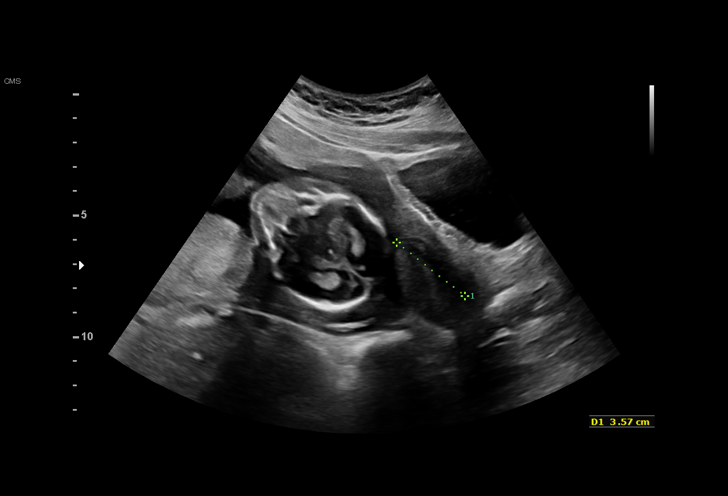
[im 10/88]
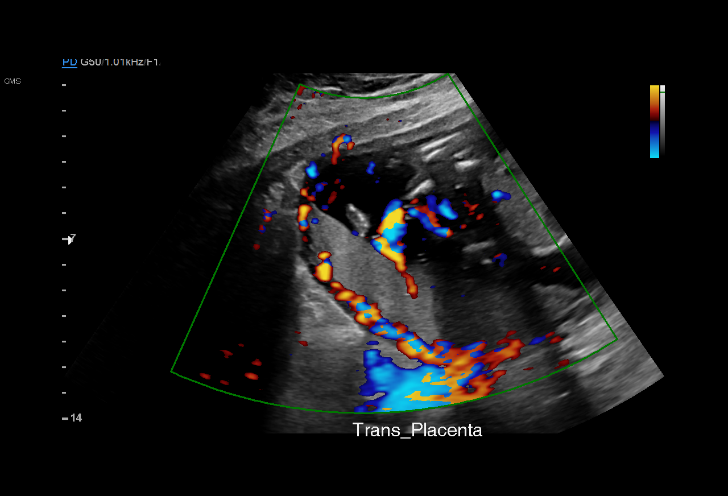
[im 17/88]
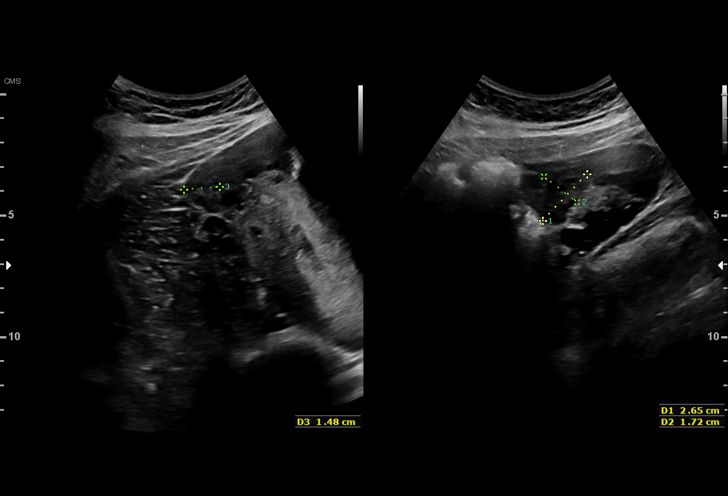
[im 23/88]
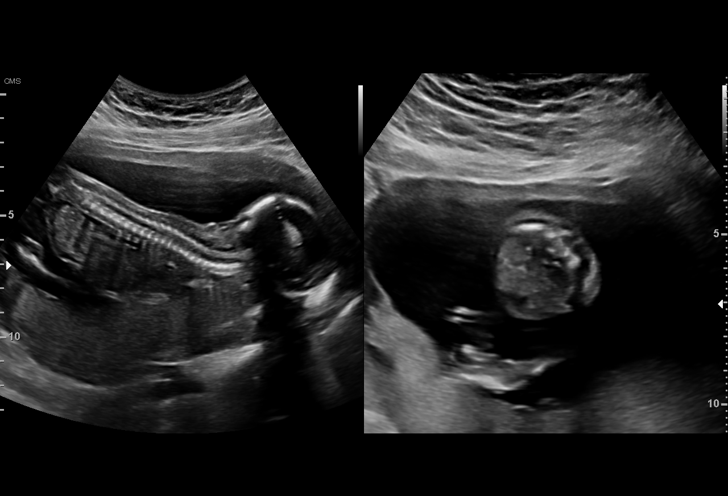
[im 30/88]
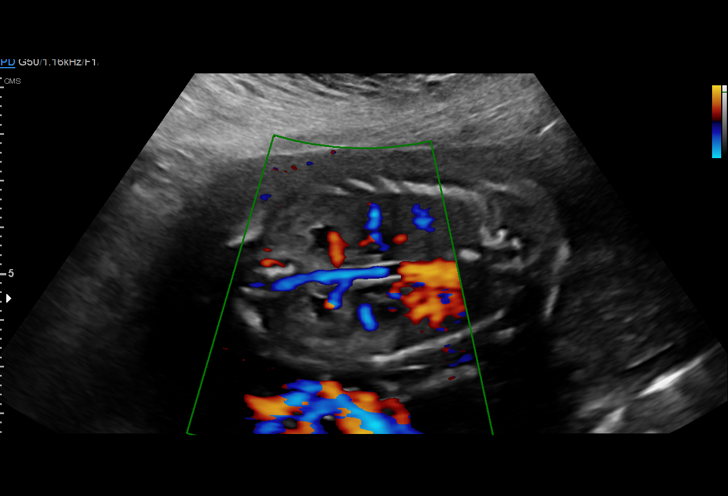
[im 36/88]
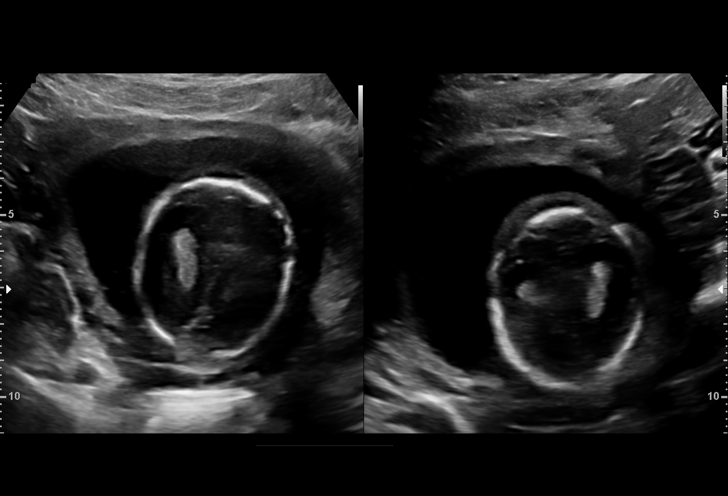
[im 42/88]
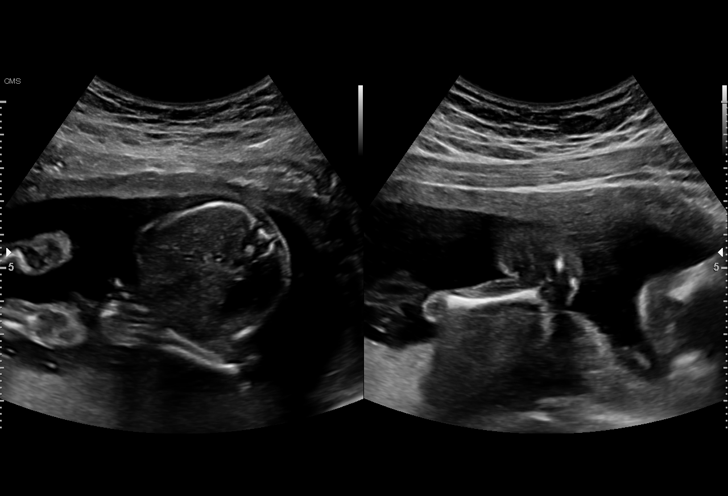
[im 49/88]
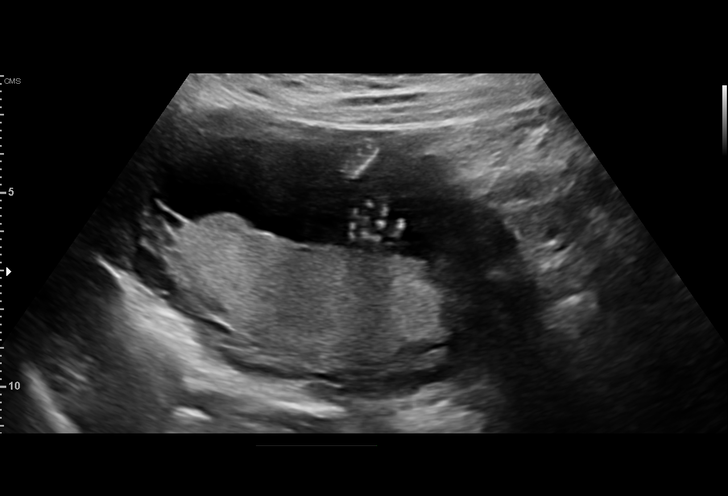
[im 55/88]
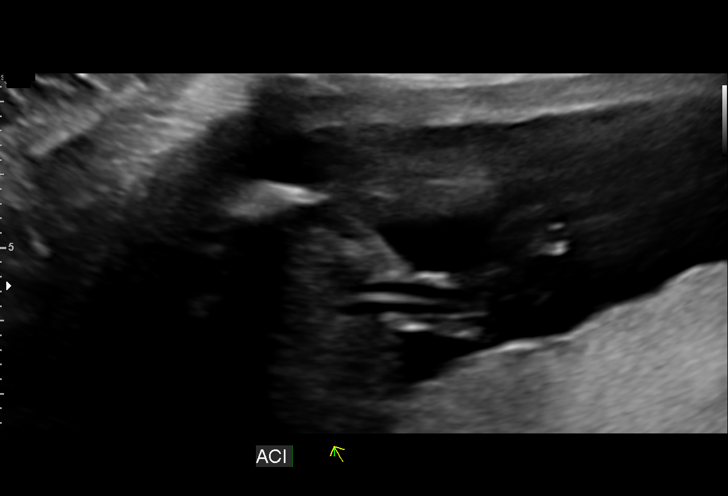
[im 62/88]
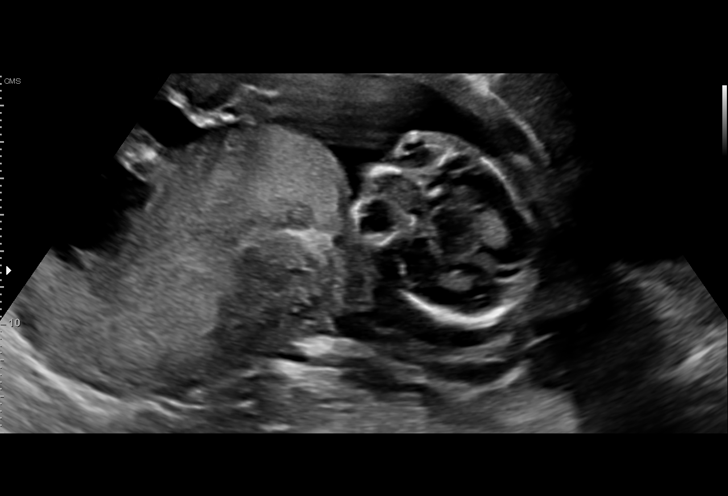
[im 68/88]
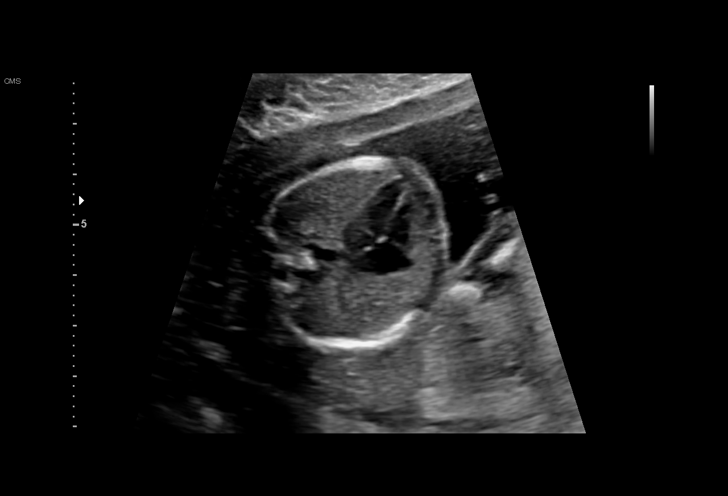
[im 75/88]
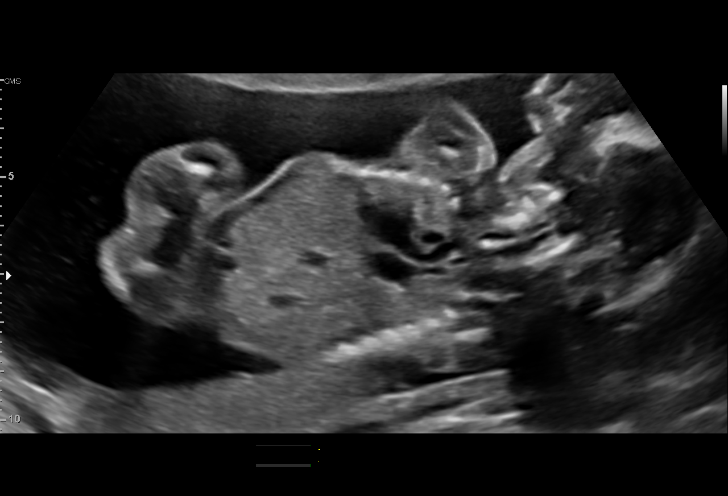
[im 81/88]
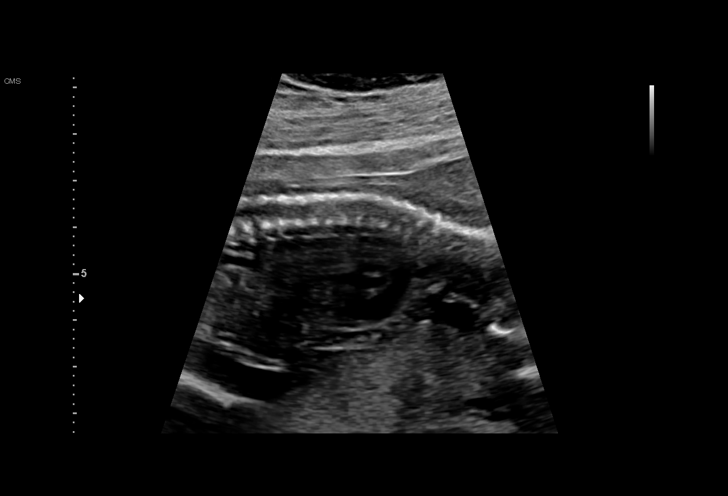
[im 88/88]
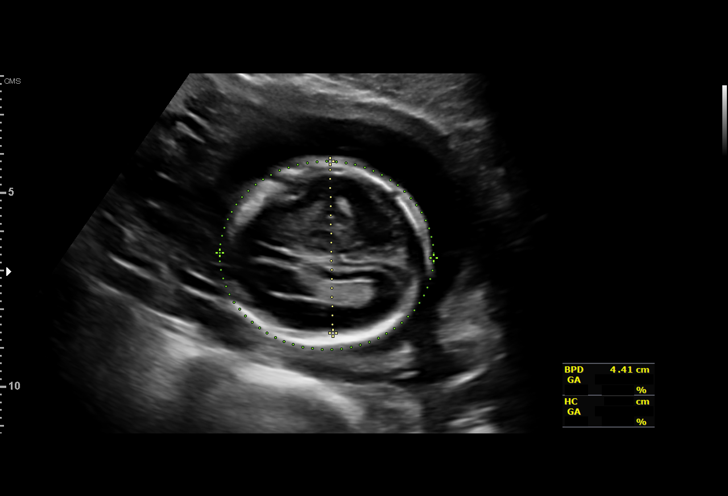

[14 of 28 positions shown; findings below may reference images not displayed]

for [REDACTED]care

1  SO GUNN            [id number]      [id number]     [id number]
Indications

19 weeks gestation of pregnancy
Encounter for antenatal screening for
malformations
OB History

Gravidity:    1         Term:   0        Prem:   0        SAB:   0
TOP:          0       Ectopic:  0        Living: 0
Fetal Evaluation

Num Of Fetuses:     1
Fetal Heart         154
Rate(bpm):
Cardiac Activity:   Observed
Presentation:       Cephalic
Placenta:           Posterior
P. Cord Insertion:  Visualized

Amniotic Fluid
AFI FV:      Subjectively within normal limits

Largest Pocket(cm)
4.83
Biometry

BPD:      44.4  mm     G. Age:  19w 3d         69  %    CI:        76.19   %    70 - 86
FL/HC:      18.9   %    16.1 -
HC:      161.2  mm     G. Age:  18w 6d         39  %    HC/AC:      1.12        1.09 -
AC:      143.5  mm     G. Age:  19w 5d         68  %    FL/BPD:     68.5   %
FL:       30.4  mm     G. Age:  19w 3d         58  %    FL/AC:      21.2   %    20 - 24
HUM:        28  mm     G. Age:  19w 0d         50  %
CER:      20.9  mm     G. Age:  19w 6d         70  %
NFT:       4.2  mm
CM:        5.3  mm

Est. FW:     296  gm    0 lb 10 oz      53  %
Gestational Age

LMP:           19w 0d        Date:  05/02/17                 EDD:   02/06/18
U/S Today:     19w 3d                                        EDD:   02/03/18
Best:          19w 0d     Det. By:  LMP  (05/02/17)          EDD:   02/06/18
Anatomy

Cranium:               Appears normal         Aortic Arch:            Appears normal
Cavum:                 Not well visualized    Ductal Arch:            Not well visualized
Ventricles:            Appears normal         Diaphragm:              Appears normal
Choroid Plexus:        Appears normal         Stomach:                Appears normal, left
sided
Cerebellum:            Appears normal         Abdomen:                Appears normal
Posterior Fossa:       Appears normal         Abdominal Wall:         Appears nml (cord
insert, abd wall)
Nuchal Fold:           Appears normal         Cord Vessels:           Appears normal (3
vessel cord)
Face:                  Orbits nl; profile not Kidneys:                Appear normal
well visualized
Lips:                  Appears normal         Bladder:                Appears normal
Thoracic:              Appears normal         Spine:                  Appears normal
Heart:                 Appears normal         Upper Extremities:      Appears normal
(4CH, axis, and
situs)
RVOT:                  Not well visualized    Lower Extremities:      Appears normal
LVOT:                  Appears normal

Other:  Female gender.  Heels and 5th digit visualized. Nasal bone
visualized. Technically difficult due to fetal position.
Cervix Uterus Adnexa

Cervix
Length:              4  cm.
Normal appearance by transabdominal scan.

Uterus
No abnormality visualized.

Left Ovary
Within normal limits.

Right Ovary
Within normal limits.

Cul De Sac:   No free fluid seen.

Adnexa:       No abnormality visualized.
Impression

We performed fetal anatomy scan. No makers of
aneuploidies or fetal structural defects are seen. Fetal
biometry is consistent with her previously-established dates.
Amniotic fluid is normal and good fetal activity is seen.

On cell-free fetal DNA screening, the risks of fetal
aneuploidies are not increased.
Recommendations

An appointment was made for her to return in 4 weeks for
completion of fetal anatomy (RVOT and ductal arch).

## 2019-11-29 ENCOUNTER — Other Ambulatory Visit: Payer: Self-pay

## 2019-11-29 ENCOUNTER — Ambulatory Visit (INDEPENDENT_AMBULATORY_CARE_PROVIDER_SITE_OTHER): Payer: Self-pay | Admitting: Nurse Practitioner

## 2019-11-29 ENCOUNTER — Encounter: Payer: Self-pay | Admitting: Nurse Practitioner

## 2019-11-29 VITALS — BP 105/69 | HR 65 | Temp 98.3°F | Ht 59.0 in | Wt 176.3 lb

## 2019-11-29 DIAGNOSIS — L0501 Pilonidal cyst with abscess: Secondary | ICD-10-CM

## 2019-11-29 DIAGNOSIS — N3000 Acute cystitis without hematuria: Secondary | ICD-10-CM | POA: Insufficient documentation

## 2019-11-29 DIAGNOSIS — Z3A01 Less than 8 weeks gestation of pregnancy: Secondary | ICD-10-CM | POA: Insufficient documentation

## 2019-11-29 MED ORDER — CEPHALEXIN 500 MG PO CAPS: 500.0000 mg | ORAL_CAPSULE | Freq: Two times a day (BID) | ORAL | 0 refills | Status: DC

## 2019-11-29 NOTE — Progress Notes (Signed)
Established Patient Office Visit  Subjective:  Patient ID: Kristin Mcdaniel, female    DOB: June 28, 1995  Age: 24 y.o. MRN: [id number]  CC:  Chief Complaint  Patient presents with  . Follow-up    [facility] on 11/25/2019  . Abscess    check incision site, I&D done on 11/25/2019, located on lower back    HPI Kristin Mcdaniel presents for follow-up for pilonidal cyst removal that was performed on 09/26 at a [place] facility.  She is also about [redacted] weeks pregnant at this time- she has not followed up with her OBGYN for prenatal care yet, but is going to the office immediately after this visit to schedule an appointment.   She denies redness, odor, purulent drainage, inflammation, red streaks, fever, chills, body aches, malaise since having the cyst removed.   She does endorse tenderness to the area.   She reports that the original physician did say that he will call in an antibiotic for suspected UTI and for the abscess however no antibiotic was called in that they are aware of.  Therefore, she has not completed any antibiotic treatment at this time.  Past Medical History:  Diagnosis Date  . Medical history non-contributory     Past Surgical History:  Procedure Laterality Date  . NO PAST SURGERIES      Family History  Problem Relation Age of Onset  . Diabetes Mother   . Hyperlipidemia Mother   . Diabetes Maternal Grandmother   . Hyperlipidemia Maternal Grandmother     Social History   Socioeconomic History  . Marital status: Single    Spouse name: Not on file  . Number of children: Not on file  . Years of education: Not on file  . Highest education level: Not on file  Occupational History  . Not on file  Tobacco Use  . Smoking status: Never Smoker  . Smokeless tobacco: Never Used  Substance and Sexual Activity  . Alcohol use: No    Comment: occasional, social drinking, limited   . Drug use: No  . Sexual activity: Yes    Birth control/protection:  Implant  Other Topics Concern  . Not on file  Social History Narrative  . Not on file   Social Determinants of Health   Financial Resource Strain:   . Difficulty of Paying Living Expenses: Not on file  Food Insecurity:   . Worried About Programme researcher, broadcasting/film/video in the Last Year: Not on file  . Ran Out of Food in the Last Year: Not on file  Transportation Needs:   . Lack of Transportation (Medical): Not on file  . Lack of Transportation (Non-Medical): Not on file  Physical Activity:   . Days of Exercise per Week: Not on file  . Minutes of Exercise per Session: Not on file  Stress:   . Feeling of Stress : Not on file  Social Connections:   . Frequency of Communication with Friends and Family: Not on file  . Frequency of Social Gatherings with Friends and Family: Not on file  . Attends Religious Services: Not on file  . Active Member of Clubs or Organizations: Not on file  . Attends Banker Meetings: Not on file  . Marital Status: Not on file  Intimate Partner Violence:   . Fear of Current or Ex-Partner: Not on file  . Emotionally Abused: Not on file  . Physically Abused: Not on file  . Sexually Abused: Not on file  Outpatient Medications Prior to Visit  Medication Sig Dispense Refill  . Etonogestrel (NEXPLANON [place]) Inject into the skin. (Patient not taking: Reported on 11/29/2019)    . ibuprofen (ADVIL,MOTRIN) 600 MG tablet Take 1 tablet (600 mg total) by mouth every 6 (six) hours. (Patient not taking: Reported on 11/29/2019) 30 tablet 0   No facility-administered medications prior to visit.    No Known Allergies  ROS Review of Systems See HPI for pertinent positives and negatives.    Objective:    Physical Exam Vitals and nursing note reviewed.  Constitutional:      Appearance: Normal appearance.  HENT:     Head: Normocephalic.  Eyes:     Extraocular Movements: Extraocular movements intact.     Conjunctiva/sclera: Conjunctivae normal.     Pupils:  Pupils are equal, round, and reactive to light.  Cardiovascular:     Rate and Rhythm: Normal rate and regular rhythm.     Pulses: Normal pulses.  Pulmonary:     Effort: Pulmonary effort is normal.  Musculoskeletal:        General: Normal range of motion.     Cervical back: Normal range of motion.  Skin:    General: Skin is warm and dry.     Capillary Refill: Capillary refill takes less than 2 seconds.       Neurological:     General: No focal deficit present.     Mental Status: She is alert and oriented to person, place, and time.  Psychiatric:        Mood and Affect: Mood normal.        Behavior: Behavior normal.        Thought Content: Thought content normal.        Judgment: Judgment normal.     BP 105/69   Pulse 65   Temp 98.3 F (36.8 C) (Oral)   Ht 4\' 11"  (1.499 m)   Wt 176 lb 4.8 oz (80 kg)   LMP 10/17/2019   SpO2 98%   BMI 35.61 kg/m  Wt Readings from Last 3 Encounters:  11/29/19 176 lb 4.8 oz (80 kg)  04/17/18 153 lb (69.4 kg)  03/02/18 153 lb (69.4 kg)     There are no preventive care reminders to display for this patient.  There are no preventive care reminders to display for this patient.  Lab Results  Component Value Date   TSH 1.64 05/10/2016   Lab Results  Component Value Date   WBC 18.0 (H) 02/02/2018   HGB 10.9 (L) 02/02/2018   HCT 32.0 (L) 02/02/2018   MCV 90.9 02/02/2018   PLT 171 02/02/2018   Lab Results  Component Value Date   NA 136 01/31/2018   K 3.6 01/31/2018   CO2 19 (L) 01/31/2018   GLUCOSE 75 01/31/2018   BUN 9 01/31/2018   CREATININE 0.48 01/31/2018   BILITOT 0.5 01/31/2018   ALKPHOS 217 (H) 01/31/2018   AST 16 01/31/2018   ALT 13 01/31/2018   PROT 6.8 01/31/2018   ALBUMIN 3.2 (L) 01/31/2018   CALCIUM 9.7 01/31/2018   ANIONGAP 9 01/31/2018   Lab Results  Component Value Date   CHOL 185 05/13/2017   Lab Results  Component Value Date   HDL 51 05/13/2017   Lab Results  Component Value Date   LDLCALC 119 (H)  05/13/2017   Lab Results  Component Value Date   TRIG 49 05/13/2017   Lab Results  Component Value Date   CHOLHDL 3.6 05/13/2017  Lab Results  Component Value Date   HGBA1C 5.0 08/10/2017      Assessment & Plan:   Problem List Items Addressed This Visit      Musculoskeletal and Integument   Pilonidal abscess - Primary    Well healing incision from pilonidal abscess drainage with small amount of iodoform gauze present exiting from the wound.  Clear/yellow drainage without odor, purulence, redness, or edema noted.  Induration noted approximately in 1.5cm area surround the incision. Remaining iodoform gauze removed without complication. Area cleansed with chlorhexidine swab and triple antibiotic ointment placed over incision. Sterile gauze taped in place.  Recommend cleansing at least twice a day with soap and water and covering with clean gauze pad.  Keflex 500mg  BID x7 days sent to pharmacy on file.  Discussed monitoring for redness, increased pain, purulent drainage, odor, fever, or chills and notifying the office if any of these present.  Follow-up if symptoms worsen or fail to improve or if cyst returns.        Relevant Medications   cephALEXin (KEFLEX) 500 MG capsule     Genitourinary   Acute cystitis without hematuria    Most recent visit to [facility] facility did reveal the presence of a UTI. Patient is [redacted] weeks pregnant and therefore we will treat the UTI. Have provided Keflex twice a day for 7 days which will cover both the pilonidal abscess and UTI symptoms. Follow-up if symptoms worsen or fail to improve.      Relevant Medications   cephALEXin (KEFLEX) 500 MG capsule     Other   Less than [redacted] weeks gestation of pregnancy    Recent visit to [facility] facility reveals [place] pregnancy via ultrasound. Patient does have a history with OB/GYN and plans to make an appointment today. Recommend continuing prenatal vitamin, increase hydration, and appropriate prenatal  follow-up care with OB/GYN.         Meds ordered this encounter  Medications  . cephALEXin (KEFLEX) 500 MG capsule    Sig: Take 1 capsule (500 mg total) by mouth 2 (two) times daily.    Dispense:  14 capsule    Refill:  0    Follow-up: Return if symptoms worsen or fail to improve.    [place], NP

## 2019-11-29 NOTE — Assessment & Plan Note (Signed)
Well healing incision from pilonidal abscess drainage with small amount of iodoform gauze present exiting from the wound.  Clear/yellow drainage without odor, purulence, redness, or edema noted.  Induration noted approximately in 1.5cm area surround the incision. Remaining iodoform gauze removed without complication. Area cleansed with chlorhexidine swab and triple antibiotic ointment placed over incision. Sterile gauze taped in place.  Recommend cleansing at least twice a day with soap and water and covering with clean gauze pad.  Keflex 500mg  BID x7 days sent to pharmacy on file.  Discussed monitoring for redness, increased pain, purulent drainage, odor, fever, or chills and notifying the office if any of these present.  Follow-up if symptoms worsen or fail to improve or if cyst returns.

## 2019-11-29 NOTE — Assessment & Plan Note (Signed)
Recent visit to [facility] facility reveals Kristin Mcdaniel pregnancy via ultrasound. Patient does have a history with OB/GYN and plans to make an appointment today. Recommend continuing prenatal vitamin, increase hydration, and appropriate prenatal follow-up care with OB/GYN.

## 2019-11-29 NOTE — Patient Instructions (Addendum)
He llamado a un antibitico llamado Keflex. Lo tomar [employer] al da durante [address] Esto ayudar con la curacin del quiste y la UTI.  Tome todo el antibitico, incluso si se siente mejor, antes de terminar todo [address]  Limpie la herida al [employer] veces al da con agua y [place]. Si nota un olor o drenaje que no es claro o amarillo claro, por favor llame y avseme. Tambin avseme si el rea se enrojece o comienza a doler ms.  Drenaje de quiste pilonidal, cuidados posteriores Pilonidal Cyst Drainage, Care After Esta hoja le brinda informacin sobre cmo cuidarse despus del procedimiento. El mdico tambin podr darle indicaciones ms especficas. Comunquese con el mdico si tiene problemas o preguntas. Qu puedo esperar despus del procedimiento? Despus del procedimiento, es comn [employer] siguientes sntomas:  Dolor que mejora al tomar [employer].  Algo de lquido o sangre que salen de la herida. Siga estas indicaciones en su casa: Medicamentos  [employer] de venta libre y los recetados solamente como se lo haya indicado el mdico.  Si le recetaron un antibitico, tmelo como se lo haya indicado el mdico. No deje de tomar el antibitico aunque comience a sentirse mejor. Estilo de vida  No realice actividades que irriten o ejerzan presin en las nalgas durante unas 2 semanas o como se lo haya indicado el mdico. Estas actividades incluyen andar en bicicleta, correr y cualquier actividad que involucre un movimiento de torsin.  No permanezca sentado durante mucho tiempo sin ponerse de pie y [place].  Duerma de costado en vez de boca arriba.  Evite usar ropa interior [place] y pantalones ajustados. Baos  No tome baos de inmersin, no se duche, no nade ni use el jacuzzi hasta que el mdico lo autorice. Esto depende del tipo de herida que tenga, consecuencia de la [place].  Al baarse, lvese suavemente la zona de las nalgas con agua y  [place].  Despus del bao: ? Seque bien la zona con una toalla limpia y [place], dando golpecitos. ? Cubra la zona con una venda limpia (vendaje), si se lo indica el mdico. Indicaciones generales   Si toma analgsicos recetados, adopte medidas para prevenir o tratar el estreimiento. El mdico podra recomendarle que haga lo siguiente: ? Beber suficiente lquido como para mantener la orina de color amarillo plido. ? Consumir alimentos ricos en fibra, como frutas y verduras frescas, cereales integrales y frijoles. ? Limitar el consumo de alimentos ricos en grasa y azcares procesados, como los alimentos fritos o dulces. ? Tomar un medicamento recetado o de venta libre para el estreimiento.  Deber tener un cuidador que lo ayude a Company secretary el cuidado de la herida y el cambio de vendaje. El cuidador debe: ? Lavarse las manos con agua y [place] antes de cambiarle el vendaje. Usar un desinfectante para manos si no dispone de [place] y [place]. ? Controlar la herida [employer] para detectar signos de infeccin tales como:  Enrojecimiento, hinchazn o ms dolor.  Mayor presencia de lquido o [place].  [place].  Pus o mal olor. ? Siga las instrucciones adicionales de su mdico sobre el cuidado de la herida, como limpieza de la herida, lavado de la herida (irrigacin) o taponar la herida con un vendaje.  Concurra a todas las visitas de control como se lo haya indicado el mdico. Esto es importante. Si tuvo una incisin y drenaje con taponamiento de la herida:  Regrese al mdico como se le indic para que le [place]  o le Horticulturist, commercial de taponamiento.  Mantenga la zona seca hasta que le hayan quitado el taponamiento.  Despus de [employer] quiten el taponamiento, puede comenzar a Corporate investment banker. Si tuvo una marsupializacin:  Puede comenzar a tomar Engineer, production despus de la ciruga o cuando el mdico lo autorice.  Retire el vendaje antes de ducharse, pero permita que el agua de la ducha  humedezca el vendaje antes de quitarlo. Esto har que sea ms fcil de quitar.  Pregntele al mdico cundo debe dejar de usar el vendaje. Si tuvo una incisin y drenaje sin taponamiento de la herida:  Cambie el vendaje segn las indicaciones.  No retire los puntos (suturas), la goma para cerrar la piel o las tiras [place]. Es posible que estos cierres cutneos deban quedar puestos en la piel durante 2semanas o ms tiempo. Si los bordes de las tiras [address] empiezan a despegarse y Scientific laboratory technician, puede recortar los que estn sueltos. No retire las tiras [employer] por completo a menos que el mdico se lo indique. Comunquese con un mdico si:  Tiene enrojecimiento, hinchazn o ms dolor alrededor de la herida.  Le sale ms lquido o sangre de la herida.  Tiene nuevo sangrado de la herida.  La herida se siente caliente al tacto.  Sale mal olor o pus de la herida.  Su dolor no se alivia con medicamentos.  Tiene fiebre o escalofros.  Tiene dolores musculares.  Tiene mareos.  Siente un Engineer, maintenance (IT). Resumen  Despus de un procedimiento para drenar un quiste pilonidal, es comn que salga algo de lquido o sangre de la herida.  Si le recetaron un antibitico, tmelo como se lo haya indicado el mdico. No deje de tomar el antibitico aunque comience a sentirse mejor.  Regrese al [employer] se le indic para que le cambien o le quiten Animator de taponamiento. Esta informacin no tiene Theme park manager el consejo del mdico. Asegrese de hacerle al mdico cualquier pregunta que tenga. Document Revised: 01/13/2018 Document Reviewed: 01/13/2018 Elsevier Patient Education  2020 [employer].

## 2019-11-29 NOTE — Assessment & Plan Note (Signed)
Most recent visit to [facility] facility did reveal the presence of a UTI. Patient is [redacted] weeks pregnant and therefore we will treat the UTI. Have provided Keflex twice a day for 7 days which will cover both the pilonidal abscess and UTI symptoms. Follow-up if symptoms worsen or fail to improve.

## 2019-12-31 ENCOUNTER — Encounter: Payer: Self-pay | Admitting: *Deleted

## 2020-01-01 ENCOUNTER — Other Ambulatory Visit: Payer: Self-pay

## 2020-01-01 ENCOUNTER — Other Ambulatory Visit (HOSPITAL_COMMUNITY)
Admission: RE | Admit: 2020-01-01 | Discharge: 2020-01-01 | Disposition: A | Discharge status: Home / Self Care | Payer: BC Managed Care – PPO | Source: Ambulatory Visit | Attending: Certified Nurse Midwife | Admitting: Certified Nurse Midwife

## 2020-01-01 ENCOUNTER — Ambulatory Visit (INDEPENDENT_AMBULATORY_CARE_PROVIDER_SITE_OTHER): Payer: BC Managed Care – PPO | Admitting: Certified Nurse Midwife

## 2020-01-01 ENCOUNTER — Encounter: Payer: Self-pay | Admitting: Certified Nurse Midwife

## 2020-01-01 VITALS — BP 122/71 | HR 69 | Wt 170.0 lb

## 2020-01-01 DIAGNOSIS — O219 Vomiting of pregnancy, unspecified: Secondary | ICD-10-CM | POA: Diagnosis not present

## 2020-01-01 DIAGNOSIS — Z3480 Encounter for supervision of other normal pregnancy, unspecified trimester: Secondary | ICD-10-CM | POA: Insufficient documentation

## 2020-01-01 DIAGNOSIS — Z3A10 10 weeks gestation of pregnancy: Secondary | ICD-10-CM

## 2020-01-01 DIAGNOSIS — R590 Localized enlarged lymph nodes: Secondary | ICD-10-CM

## 2020-01-01 DIAGNOSIS — Z3481 Encounter for supervision of other normal pregnancy, first trimester: Secondary | ICD-10-CM | POA: Insufficient documentation

## 2020-01-01 MED ORDER — DICLEGIS 10-10 MG PO TBEC: 2.0000 | DELAYED_RELEASE_TABLET | Freq: Every day | ORAL | 2 refills | Status: DC

## 2020-01-01 NOTE — Patient Instructions (Signed)

## 2020-01-01 NOTE — Progress Notes (Signed)
Subjective:    Kristin Mcdaniel is a 24 y.o. G2P1001 at [redacted]w[redacted]d being seen today for her first obstetrical visit.  Her obstetrical history is significant for obesity and has Benign heart murmur; Pilonidal abscess; Acute cystitis without hematuria; Less than [redacted] weeks gestation of pregnancy; and Supervision of other normal pregnancy, antepartum on their problem list.. Patient does intend to breast feed. Pregnancy history fully reviewed.  Patient reports nausea and vomiting multiple times a day. She has noticed a decrease in energy. She denies vaginal bleeding or cramping. She was concerned about left unilateral lymph node swelling that began a couple months ago, but has recently decreased in size. She denies any recent illness, but had the Covid vaccine in September in the left arm. Patient denies any tenderness or pain.   HISTORY: OB History  Gravida Para Term Preterm AB Living  2 1 1  0 0 1  SAB TAB Ectopic Multiple Live Births  0 0 0 0 1    # Outcome Date GA Lbr Len/2nd Weight Sex Delivery Anes PTL Lv  2 Current           1 Term 02/01/18 [redacted]w[redacted]d 11:41 / 03:16 7 lb 10.4 oz (3.47 kg) F Vag-Vacuum EPI  LIV     Name: Vanostrand,GIRL Jamila     Apgar1: 6  Apgar5: 9   Past Medical History:  Diagnosis Date  . Medical history non-contributory    Past Surgical History:  Procedure Laterality Date  . NO PAST SURGERIES     Family History  Problem Relation Age of Onset  . Diabetes Mother   . Hyperlipidemia Mother   . Diabetes Maternal Grandmother   . Hyperlipidemia Maternal Grandmother    Social History   Tobacco Use  . Smoking status: Never Smoker  . Smokeless tobacco: Never Used  Substance Use Topics  . Alcohol use: No    Comment: occasional, social drinking, limited   . Drug use: No   No Known Allergies Current Outpatient Medications on File Prior to Visit  Medication Sig Dispense Refill  . cephALEXin (KEFLEX) 500 MG capsule Take 1 capsule (500 mg total) by mouth 2 (two) times daily.  (Patient not taking: Reported on 01/01/2020) 14 capsule 0   No current facility-administered medications on file prior to visit.     Indications for ASA therapy (per uptodate) One of the following: Previous pregnancy with preeclampsia, especially early onset and with an adverse outcome No Multifetal gestation No Chronic hypertension No Type 1 or 2 diabetes mellitus No Chronic kidney disease No Autoimmune disease (antiphospholipid syndrome, systemic lupus erythematosus) No   Two or more of the following: Nulliparity No Obesity (body mass index >30 kg/m2) Yes Family history of preeclampsia in mother or sister No Age ?35 years No Sociodemographic characteristics (African American race, low socioeconomic level) No Personal risk factors (eg, previous pregnancy with low birth weight or small for gestational age infant, previous adverse pregnancy outcome [eg, stillbirth], interval >10 years between pregnancies) No   Indications for early 1 hour GTT (per uptodate) BMI >25 (>23 in Asian women) AND one of the following  Gestational diabetes mellitus in a previous pregnancy No Glycated hemoglobin ?5.7 percent (39 mmol/mol), impaired glucose tolerance, or impaired fasting glucose on previous testing No First-degree relative with diabetes Yes High-risk race/ethnicity (eg, African American, Latino, Native American, 13/04/2019 American, Pacific Islander) Yes History of cardiovascular disease No Hypertension or on therapy for hypertension No High-density lipoprotein cholesterol level <35 mg/dL ([place] mmol/L) and/or a  triglyceride level >250 mg/dL (6.75 mmol/L) No Polycystic ovary syndrome No Physical inactivity No Other clinical condition associated with insulin resistance (eg, severe obesity, acanthosis nigricans) No Previous birth of an infant weighing ?4000 g No Previous stillbirth of unknown cause No Exam   Vitals:   01/01/20 0821  BP: 122/71  Pulse: 69  Weight: 170 lb (77.1 kg)   Fetal  Heart Rate (bpm): 165  System: General: well-developed, well-nourished female in no acute distress   Breast:  normal appearance, no masses or tenderness   Skin: normal coloration and turgor, no rashes Enlarged lymph nodes in right and left axilla. Non tenderness, mobile, 2cm.    Neurologic: oriented, normal, negative, normal mood   Neck supple and no masses   Cardiovascular: regular rate and rhythm   Respiratory:  no respiratory distress, normal breath sounds   Abdomen: soft, non-tender; no masses,  no organomegaly     Assessment:   Pregnancy: G2P1001 Patient Active Problem List   Diagnosis Date Noted  . Supervision of other normal pregnancy, antepartum 01/01/2020  . Pilonidal abscess 11/29/2019  . Acute cystitis without hematuria 11/29/2019  . Less than [redacted] weeks gestation of pregnancy 11/29/2019  . Benign heart murmur 05/10/2016     Plan:   1. Supervision of other normal pregnancy, antepartum - Introduced self to patient  - routine prenatal care  - anticipatory guidance on upcoming appointments and structure of prenatal visits  - educated and discussed genetic screening today, patient agrees to testing  - Obstetric panel - HIV antibody (with reflex) - Hepatitis C Antibody - Culture, OB Urine - [place] bedside; Future - Enroll Patient in Babyscripts - HgB A1c - Urine cytology ancillary only([place]) - Genetic Screening  2. Nausea and vomiting during pregnancy - educated on small frequent meals  - DICLEGIS 10-10 MG TBEC; Take 2 tablets by mouth at bedtime. Take 2 tablets at bedtime. If not helping can increase to 1 tablet in morning in addition to bedtime on day 3.  Dispense: 60 tablet; Refill: 2  3. [redacted] weeks gestation of pregnancy  4. Enlarged lymph nodes in armpit - most likely in association of covid vaccination, will continue to monitor closely    Initial labs drawn. Continue prenatal vitamins. Discussed and offered genetic screening  options Benefits/risks/alternatives reviewed. Pt aware that anatomy US is form of genetic screening with lower accuracy in detecting trisomies than blood work.  Pt chooses genetic screening today. NIPS: ordered. Ultrasound discussed; fetal anatomic survey: requested. Problem list reviewed and updated. The nature of [facility] with multiple MDs and other Advanced Practice Providers was explained to patient; also emphasized that residents, students are part of our team. Routine obstetric precautions reviewed. Return in about 4 weeks (around 01/29/2020) for ROB.   Zadie Cleverly, Student-PA 01/01/20 9:20 AM   Attestation of Supervision of Student:  I confirm that I have verified the information documented in the physician assistant student's note and that I have also personally reperformed the history, physical exam and all medical decision making activities.  I have verified that all services and findings are accurately documented in this student's note; and I agree with management and plan as outlined in the documentation. I have also made any necessary editorial changes.  Sharyon Cable, CNM [facility] [employer], [facility] 01/01/2020 3:39 PM

## 2020-01-01 NOTE — Progress Notes (Signed)
Bedside U/S shows single IUP with FHT of 165 BPM and CRL measures [redacted]w[redacted]d  45.15mm

## 2020-01-02 LAB — OBSTETRIC PANEL
Absolute Monocytes: 462 cells/uL (ref 200–950)
Antibody Screen: NOT DETECTED
Basophils Absolute: 20 cells/uL (ref 0–200)
Basophils Relative: 0.3 %
Eosinophils Absolute: 68 cells/uL (ref 15–500)
Eosinophils Relative: 1 %
HCT: 35.3 % (ref 35.0–45.0)
Hemoglobin: 11.9 g/dL (ref 11.7–15.5)
Hepatitis B Surface Ag: NONREACTIVE
Lymphs Abs: 1775 cells/uL (ref 850–3900)
MCH: 29.4 pg (ref 27.0–33.0)
MCHC: 33.7 g/dL (ref 32.0–36.0)
MCV: 87.2 fL (ref 80.0–100.0)
MPV: 12.4 fL (ref 7.5–12.5)
Monocytes Relative: 6.8 %
Neutro Abs: 4474 cells/uL (ref 1500–7800)
Neutrophils Relative %: 65.8 %
Platelets: 241 10*3/uL (ref 140–400)
RBC: 4.05 10*6/uL (ref 3.80–5.10)
RDW: 13.1 % (ref 11.0–15.0)
RPR Ser Ql: NONREACTIVE
Rubella: 7.79 Index
Total Lymphocyte: 26.1 %
WBC: 6.8 10*3/uL (ref 3.8–10.8)

## 2020-01-02 LAB — HEMOGLOBIN A1C
Hgb A1c MFr Bld: 5.3 % of total Hgb (ref <5.7)
Mean Plasma Glucose: 105 (calc)
eAG (mmol/L): 5.8 (calc)

## 2020-01-02 LAB — HEPATITIS C ANTIBODY
Hepatitis C Ab: NONREACTIVE
SIGNAL TO CUT-OFF: 0.01 (ref <1.00)

## 2020-01-02 LAB — HIV ANTIBODY (ROUTINE TESTING W REFLEX): HIV 1&2 Ab, 4th Generation: NONREACTIVE

## 2020-01-03 LAB — URINE CYTOLOGY ANCILLARY ONLY
Chlamydia: NEGATIVE
Comment: NEGATIVE
Comment: NEGATIVE
Comment: NORMAL
Neisseria Gonorrhea: NEGATIVE
Trichomonas: NEGATIVE

## 2020-01-04 LAB — CULTURE, OB URINE

## 2020-01-04 LAB — URINE CULTURE, OB REFLEX

## 2020-01-07 ENCOUNTER — Encounter: Payer: Self-pay | Admitting: *Deleted

## 2020-01-07 DIAGNOSIS — Z3480 Encounter for supervision of other normal pregnancy, unspecified trimester: Secondary | ICD-10-CM

## 2020-02-05 ENCOUNTER — Other Ambulatory Visit: Payer: Self-pay

## 2020-02-05 ENCOUNTER — Ambulatory Visit (INDEPENDENT_AMBULATORY_CARE_PROVIDER_SITE_OTHER): Payer: BC Managed Care – PPO | Admitting: Advanced Practice Midwife

## 2020-02-05 VITALS — BP 124/75 | HR 71 | Wt 170.0 lb

## 2020-02-05 DIAGNOSIS — Z3480 Encounter for supervision of other normal pregnancy, unspecified trimester: Secondary | ICD-10-CM

## 2020-02-05 DIAGNOSIS — Z3A15 15 weeks gestation of pregnancy: Secondary | ICD-10-CM | POA: Diagnosis not present

## 2020-02-05 NOTE — Patient Instructions (Signed)
Segundo trimestre de embarazo Second Trimester of Pregnancy El segundo trimestre va desde la semana14 hasta la 27, desde el cuarto hasta el sexto mes, y suele ser el momento en el que mejor se siente. Su organismo se ha adaptado a estar embarazada, y comienza a sentirse fsicamente mejor. En general, las nuseas matutinas han disminuido o han desaparecido completamente, puede tener ms energa y un aumento de apetito. El segundo trimestre es tambin la poca en la que el feto se desarrolla rpidamente. Hacia el final del sexto mes, el feto mide aproximadamente 9pulgadas (23cm) y pesa alrededor de 1 libras (700g). Es probable que sienta que el beb se mueve (da pataditas) entre las 16 y 20semanas del embarazo. Cambios en el cuerpo durante el segundo trimestre Su cuerpo continua experimentando numerosos cambios durante su segundo trimestre. Estos cambios varan de una mujer a otra.  Seguir aumentando de peso. Notar que la parte baja del abdomen sobresale.  Podrn aparecer las primeras estras en las caderas, el abdomen y las mamas.  Es posible que tenga dolores de cabeza que pueden aliviarse con ciertos medicamentos. Los medicamentos que tome deben estar aprobados por el mdico.  Tal vez tenga necesidad de orinar con ms frecuencia porque el feto est ejerciendo presin sobre la vejiga.  Debido al embarazo podr sentir acidez estomacal con frecuencia.  Puede estar estreida, ya que ciertas hormonas enlentecen los movimientos de los msculos que empujan los desechos a travs de los intestinos.  Pueden aparecer hemorroides o abultarse e hincharse las venas (venas varicosas).  Puede sentir dolor en la espalda. Esto se debe a: ? Aumento de peso. ? Las hormonas del embarazo relajan las articulaciones en la pelvis. ? Un cambio en el peso y los msculos que ayudan a mantener su equilibrio.  Sus pechos seguirn creciendo y se pondrn cada vez ms sensibles.  Las encas pueden sangrar y estar  sensibles al cepillado y al hilo dental.  Pueden aparecer zonas oscuras o manchas (cloasma, mscara del embarazo) en el rostro. Esto probablemente se atenuar despus del nacimiento del beb.  Es posible que se forme una lnea oscura desde el ombligo hasta la zona del pubis (linea nigra). Esto probablemente se atenuar despus del nacimiento del beb.  Tal vez haya cambios en el cabello. Esto cambios pueden incluir su engrosamiento, crecimiento rpido y cambios en la textura. Adems, a algunas mujeres se les cae el cabello durante o despus del embarazo, o tienen el cabello seco o fino. Lo ms probable es que el cabello se le normalice despus del nacimiento del beb. Qu debe esperar en las visitas prenatales Durante una visita prenatal de rutina:  La pesarn para asegurarse de que usted y el feto estn creciendo normalmente.  Le tomarn la presin arterial.  Le medirn el abdomen para controlar el desarrollo del beb.  Se escucharn los latidos cardacos fetales.  Se evaluarn los resultados de los estudios solicitados en visitas anteriores. El mdico puede preguntarle lo siguiente:  Cmo se siente.  Si siente los movimientos del beb.  Si ha tenido sntomas anormales, como prdida de lquido, sangrado, dolores de cabeza intensos o clicos abdominales.  Si est consumiendo algn producto que contenga tabaco, como cigarrillos, tabaco de mascar y cigarrillos electrnicos.  Si tiene alguna pregunta. Otros estudios que podrn realizarse durante el segundo trimestre incluyen lo siguiente:  Anlisis de sangre para detectar lo siguiente: ? Concentraciones de hierro bajas (anemia). ? Nivel alto de azcar en la sangre que afecta a las mujeres embarazadas (diabetes   gestacional) entre las semanas 24 y 28. ? Anticuerpos Rh. Esto es para detectar una protena en los glbulos rojos (factor Rh).  Anlisis de orina para detectar infecciones, diabetes o protenas en la orina.  Una ecografa  para confirmar que el beb crece y se desarrolla correctamente.  Una amniocentesis para diagnosticar posibles problemas genticos.  Estudios del feto para descartar espina bfida y sndrome de Down.  Prueba del VIH (virus de inmunodeficiencia humana). Los exmenes prenatales de rutina incluyen la prueba de deteccin del VIH, a menos que decida no realizrsela. Siga estas indicaciones en su casa: Medicamentos  Siga las indicaciones del mdico en relacin con el uso de medicamentos. Durante el embarazo, hay medicamentos que pueden tomarse y otros que no.  Tome vitaminas prenatales que contengan por lo menos 600microgramos (?g) de cido flico.  Si est estreida, tome un laxante suave, si el mdico lo autoriza. Qu debe comer y beber   Lleve una dieta equilibrada que incluya gran cantidad de frutas y verduras frescas, cereales integrales, buenas fuentes de protenas como carnes magras, huevos o tofu, y lcteos descremados. El mdico la ayudar a determinar la cantidad de peso que puede aumentar.  No coma carne cruda ni quesos sin cocinar. Estos elementos contienen grmenes que pueden causar defectos congnitos en el beb.  Si no consume muchos alimentos con calcio, hable con su mdico sobre si debera tomar un suplemento diario de calcio.  Limite el consumo de alimentos con alto contenido de grasas y azcares procesados, como alimentos fritos o dulces.  Para evitar el estreimiento: ? Bebe suficiente lquido para mantener la orina clara o de color amarillo plido. ? Consuma alimentos ricos en fibra, como frutas y verduras frescas, cereales integrales y frijoles. Actividad  Haga ejercicio solamente como se lo haya indicado el mdico. La mayora de las mujeres pueden continuar su rutina de ejercicios durante el embarazo. Intente realizar como mnimo 30minutos de actividad fsica por lo menos 5das a la semana. Deje de hacer ejercicio si experimenta contracciones uterinas.  No levante  objetos pesados, use zapatos de tacones bajos y mantenga una buena postura.  Puede seguir manteniendo relaciones sexuales, a menos que el mdico le indique lo contrario. Alivio del dolor y del malestar  Use un sostn que le brinde buen soporte para prevenir las molestias causadas por la sensibilidad en los pechos.  Dese baos de asiento con agua tibia para aliviar el dolor o las molestias causadas por las hemorroides. Use una crema para las hemorroides si el mdico la autoriza.  Descanse con las piernas elevadas si tiene calambres o dolor de cintura.  Si tiene venas varicosas, use medias de descanso. Eleve los pies durante 15minutos, 3 o 4veces por da. Limite el consumo de sal en su dieta. Cuidados prenatales  Escriba sus preguntas. Llvelas cuando concurra a las visitas prenatales.  Concurra a todas las visitas prenatales tal como se lo haya indicado el mdico. Esto es importante. Seguridad  Use el cinturn de seguridad en todo momento mientras conduce.  Haga una lista de los nmeros de telfono de emergencia, que incluya los nmeros de telfono de familiares, amigos, el hospital y los departamentos de polica y bomberos. Instrucciones generales  Pdale al mdico que la derive a clases de educacin prenatal en su localidad. Debe comenzar a tomar las clases antes de que empiece el mes6 de embarazo.  Pida ayuda si tiene necesidades nutricionales o de asesoramiento durante el embarazo. El mdico puede aconsejarla o derivarla a especialistas para que la   ayuden con diferentes necesidades.  No se d baos de inmersin en agua caliente, baos turcos ni saunas.  No se haga duchas vaginales ni use tampones o toallas higinicas perfumadas.  No mantenga las piernas cruzadas durante mucho tiempo.  Evite el contacto con las bandejas sanitarias de los gatos y la tierra que estos animales usan. Estos elementos contienen bacterias que pueden causar defectos congnitos al beb y la posible  prdida del feto debido a un aborto espontneo o muerte fetal.  Evite fumar, consumir hierbas, beber alcohol y tomar frmacos que no le hayan recetado. Las sustancias qumicas que estos productos contienen pueden afectar la formacin y el desarrollo del beb.  No consuma ningn producto que contenga nicotina o tabaco, como cigarrillos y cigarrillos electrnicos. Si necesita ayuda para dejar de fumar, consulte al mdico.  Visite a su dentista si an no lo ha hecho durante el embarazo. Use un cepillo de dientes blando para higienizarse los dientes y psese el hilo dental con suavidad. Comunquese con un mdico si:  Tiene mareos.  Siente clicos leves, presin en la pelvis o dolor persistente en el abdomen.  Tiene nuseas, vmitos o diarrea persistentes.  Observa una secrecin vaginal con mal olor.  Siente dolor al orinar. Solicite ayuda de inmediato si:  Tiene fiebre.  Tiene una prdida de lquido por la vagina.  Tiene sangrado o pequeas prdidas vaginales.  Siente dolor intenso o clicos en el abdomen.  Sube de peso o baja de peso rpidamente.  Tiene dificultad para respirar y siente dolor de pecho.  Sbitamente se le hinchan mucho el rostro, las manos, los tobillos, los pies o las piernas.  No ha sentido los movimientos del beb durante una hora.  Siente un dolor de cabeza intenso que no se alivia al tomar medicamentos.  Nota cambios en la visin. Resumen  El segundo trimestre va desde la semana14 hasta la 27, desde el cuarto hasta el sexto mes. Es tambin una poca en la que el feto se desarrolla rpidamente.  Su organismo atraviesa por muchos cambios durante el embarazo. Estos cambios varan de una mujer a otra.  Evite fumar, consumir hierbas, beber alcohol y tomar frmacos que no le hayan recetado. Estas sustancias qumicas afectan la formacin y el desarrollo de su beb.  No consuma ningn producto que contenga tabaco, lo que incluye cigarrillos, tabaco de mascar  y cigarrillos electrnicos. Si necesita ayuda para dejar de fumar, consulte al mdico.  Comunquese con su mdico si tiene preguntas sobre esto. Concurra a todas las visitas prenatales tal como se lo haya indicado el mdico. Esto es importante. Esta informacin no tiene como fin reemplazar el consejo del mdico. Asegrese de hacerle al mdico cualquier pregunta que tenga. Document Revised: 06/28/2016 Document Reviewed: 06/28/2016 Elsevier Patient Education  2020 Elsevier Inc.  

## 2020-02-05 NOTE — Progress Notes (Signed)
   PRENATAL VISIT NOTE  Subjective:  Kristin Mcdaniel is a 24 y.o. G2P1001 at [redacted]w[redacted]d being seen today for ongoing prenatal care.  She is currently monitored for the following issues for this low-risk pregnancy and has Benign heart murmur; Pilonidal abscess; Acute cystitis without hematuria; Less than [redacted] weeks gestation of pregnancy; Supervision of other normal pregnancy, antepartum; Enlarged lymph nodes in armpit; and Nausea and vomiting in pregnancy on their problem list.  Patient reports no complaints.  Contractions: Not present. Vag. Bleeding: None.  Movement: Absent. Denies leaking of fluid.   The following portions of the patient's history were reviewed and updated as appropriate: allergies, current medications, past family history, past medical history, past social history, past surgical history and problem list.   Objective:   Vitals:   02/05/20 0815  BP: 124/75  Pulse: 71  Weight: 170 lb (77.1 kg)    Fetal Status: Fetal Heart Rate (bpm): 142   Movement: Absent     General:  Alert, oriented and cooperative. Patient is in no acute distress.  Skin: Skin is warm and dry. No rash noted.   Cardiovascular: Normal heart rate noted  Respiratory: Normal respiratory effort, no problems with respiration noted  Abdomen: Soft, gravid, appropriate for gestational age.  Pain/Pressure: Present     Pelvic: Cervical exam deferred        Extremities: Normal range of motion.  Edema: None  Mental Status: Normal mood and affect. Normal behavior. Normal judgment and thought content.   Assessment and Plan:  Pregnancy: G2P1001 at [redacted]w[redacted]d 1. [redacted] weeks gestation of pregnancy  - Alpha fetoprotein, maternal  2. Supervision of other normal pregnancy, antepartum --Anticipatory guidance about next visits/weeks of pregnancy given. --Questions answered about breast changes in pregnancy as readiness for breastfeeding --Anatomy US ordered --Next appt in 4 weeks in office  Preterm labor symptoms and general  obstetric precautions including but not limited to vaginal bleeding, contractions, leaking of fluid and fetal movement were reviewed in detail with the patient. Please refer to After Visit Summary for other counseling recommendations.   Return in about 4 weeks (around 03/04/2020).  No future appointments.  Sharen Counter, CNM

## 2020-02-07 LAB — ALPHA FETOPROTEIN, MATERNAL
AFP MoM: 1.08
AFP, Serum: 30.5 ng/mL
Calc'd Gestational Age: 15.9 weeks
Maternal Wt: 170 [lb_av]
Risk for ONTD: <1
Twins-AFP: 1

## 2020-03-01 NOTE — L&D Delivery Note (Signed)
Delivery Note At 12:26 PM a viable female was delivered via Vaginal, Spontaneous (Presentation: Right Occiput Anterior).  APGAR: 7, 9; weight pending .   Placenta status: Spontaneous, Intact.  Cord: 3 vessels with the following complications: None.  Cord pH: NA  Anesthesia: Epidural Episiotomy: None Lacerations: 2nd degree;Vaginal;Perineal Suture Repair: 3.0 vicryl Est. Blood Loss (mL): 200  Mom to postpartum after BTL.  Baby to Couplet care / Skin to Skin.  [name] 07/18/2020, 1:08 PM

## 2020-03-04 ENCOUNTER — Ambulatory Visit (INDEPENDENT_AMBULATORY_CARE_PROVIDER_SITE_OTHER): Payer: BC Managed Care – PPO | Admitting: Advanced Practice Midwife

## 2020-03-04 ENCOUNTER — Other Ambulatory Visit: Payer: Self-pay

## 2020-03-04 VITALS — BP 114/66 | HR 76 | Wt 169.0 lb

## 2020-03-04 DIAGNOSIS — Z3A19 19 weeks gestation of pregnancy: Secondary | ICD-10-CM

## 2020-03-04 DIAGNOSIS — Z3480 Encounter for supervision of other normal pregnancy, unspecified trimester: Secondary | ICD-10-CM

## 2020-03-04 NOTE — Progress Notes (Signed)
   PRENATAL VISIT NOTE  Subjective:  Kristin Mcdaniel is a 25 y.o. G2P1001 at [redacted]w[redacted]d being seen today for ongoing prenatal care.  She is currently monitored for the following issues for this low-risk pregnancy and has Benign heart murmur; Pilonidal abscess; Acute cystitis without hematuria; Less than [redacted] weeks gestation of pregnancy; Supervision of other normal pregnancy, antepartum; Enlarged lymph nodes in armpit; and Nausea and vomiting in pregnancy on their problem list.  Patient reports no complaints.  Contractions: Not present. Vag. Bleeding: None.  Movement: Present. Denies leaking of fluid.   The following portions of the patient's history were reviewed and updated as appropriate: allergies, current medications, past family history, past medical history, past social history, past surgical history and problem list.   Objective:   Vitals:   03/04/20 0917  BP: 114/66  Pulse: 76  Weight: 169 lb (76.7 kg)    Fetal Status: Fetal Heart Rate (bpm): 138 Fundal Height: 20 cm Movement: Present     General:  Alert, oriented and cooperative. Patient is in no acute distress.  Skin: Skin is warm and dry. No rash noted.   Cardiovascular: Normal heart rate noted  Respiratory: Normal respiratory effort, no problems with respiration noted  Abdomen: Soft, gravid, appropriate for gestational age.  Pain/Pressure: Present     Pelvic: Cervical exam deferred        Extremities: Normal range of motion.  Edema: None  Mental Status: Normal mood and affect. Normal behavior. Normal judgment and thought content.   Assessment and Plan:  Pregnancy: G2P1001 at [redacted]w[redacted]d 1. [redacted] weeks gestation of pregnancy   2. Supervision of other normal pregnancy, antepartum --Anticipatory guidance about next visits/weeks of pregnancy given. --Answered pt questions about safe travel in pregnancy, if pregnancy is going well, the family plans trip to [place] in March to see her mother, will be 29-30 weeks. --[place] on 03/08/19, routine  anatomy US --Next visit in 4 weeks in office  Preterm labor symptoms and general obstetric precautions including but not limited to vaginal bleeding, contractions, leaking of fluid and fetal movement were reviewed in detail with the patient. Please refer to After Visit Summary for other counseling recommendations.   Return in about 4 weeks (around 04/01/2020).  Future Appointments  Date Time Provider Department Center  03/07/2020  7:45 AM [facility] US4 [facility]    Sharen Counter, CNM

## 2020-03-04 NOTE — Patient Instructions (Signed)
Segundo trimestre de embarazo Second Trimester of Pregnancy El segundo trimestre va desde la semana14 hasta la 27, desde el cuarto hasta el sexto mes, y suele ser el momento en el que mejor se siente. Su organismo se ha adaptado a estar embarazada, y comienza a sentirse fsicamente mejor. En general, las nuseas matutinas han disminuido o han desaparecido completamente, puede tener ms energa y un aumento de apetito. El segundo trimestre es tambin la poca en la que el feto se desarrolla rpidamente. Hacia el final del sexto mes, el feto mide aproximadamente 9pulgadas (23cm) y pesa alrededor de 1 libras (700g). Es probable que sienta que el beb se mueve (da pataditas) entre las 16 y 20semanas del embarazo. Cambios en el cuerpo durante el segundo trimestre Su cuerpo continua experimentando numerosos cambios durante su segundo trimestre. Estos cambios varan de una mujer a otra.  Seguir aumentando de peso. Notar que la parte baja del abdomen sobresale.  Podrn aparecer las primeras estras en las caderas, el abdomen y las mamas.  Es posible que tenga dolores de cabeza que pueden aliviarse con ciertos medicamentos. Los medicamentos que tome deben estar aprobados por el mdico.  Tal vez tenga necesidad de orinar con ms frecuencia porque el feto est ejerciendo presin sobre la vejiga.  Debido al embarazo podr sentir acidez estomacal con frecuencia.  Puede estar estreida, ya que ciertas hormonas enlentecen los movimientos de los msculos que empujan los desechos a travs de los intestinos.  Pueden aparecer hemorroides o abultarse e hincharse las venas (venas varicosas).  Puede sentir dolor en la espalda. Esto se debe a: ? Aumento de peso. ? Las hormonas del embarazo relajan las articulaciones en la pelvis. ? Un cambio en el peso y los msculos que ayudan a mantener su equilibrio.  Sus pechos seguirn creciendo y se pondrn cada vez ms sensibles.  Las encas pueden sangrar y estar  sensibles al cepillado y al hilo dental.  Pueden aparecer zonas oscuras o manchas (cloasma, mscara del embarazo) en el rostro. Esto probablemente se atenuar despus del nacimiento del beb.  Es posible que se forme una lnea oscura desde el ombligo hasta la zona del pubis (linea nigra). Esto probablemente se atenuar despus del nacimiento del beb.  Tal vez haya cambios en el cabello. Esto cambios pueden incluir su engrosamiento, crecimiento rpido y cambios en la textura. Adems, a algunas mujeres se les cae el cabello durante o despus del embarazo, o tienen el cabello seco o fino. Lo ms probable es que el cabello se le normalice despus del nacimiento del beb. Qu debe esperar en las visitas prenatales Durante una visita prenatal de rutina:  La pesarn para asegurarse de que usted y el feto estn creciendo normalmente.  Le tomarn la presin arterial.  Le medirn el abdomen para controlar el desarrollo del beb.  Se escucharn los latidos cardacos fetales.  Se evaluarn los resultados de los estudios solicitados en visitas anteriores. El mdico puede preguntarle lo siguiente:  Cmo se siente.  Si siente los movimientos del beb.  Si ha tenido sntomas anormales, como prdida de lquido, sangrado, dolores de cabeza intensos o clicos abdominales.  Si est consumiendo algn producto que contenga tabaco, como cigarrillos, tabaco de mascar y cigarrillos electrnicos.  Si tiene alguna pregunta. Otros estudios que podrn realizarse durante el segundo trimestre incluyen lo siguiente:  Anlisis de sangre para detectar lo siguiente: ? Concentraciones de hierro bajas (anemia). ? Nivel alto de azcar en la sangre que afecta a las mujeres embarazadas (diabetes   gestacional) entre las semanas 24 y 28. ? Anticuerpos Rh. Esto es para detectar una protena en los glbulos rojos (factor Rh).  Anlisis de orina para detectar infecciones, diabetes o protenas en la orina.  Una ecografa  para confirmar que el beb crece y se desarrolla correctamente.  Una amniocentesis para diagnosticar posibles problemas genticos.  Estudios del feto para descartar espina bfida y sndrome de Down.  Prueba del VIH (virus de inmunodeficiencia humana). Los exmenes prenatales de rutina incluyen la prueba de deteccin del VIH, a menos que decida no realizrsela. Siga estas indicaciones en su casa: Medicamentos  Siga las indicaciones del mdico en relacin con el uso de medicamentos. Durante el embarazo, hay medicamentos que pueden tomarse y otros que no.  Tome vitaminas prenatales que contengan por lo menos 600microgramos (?g) de cido flico.  Si est estreida, tome un laxante suave, si el mdico lo autoriza. Qu debe comer y beber   Lleve una dieta equilibrada que incluya gran cantidad de frutas y verduras frescas, cereales integrales, buenas fuentes de protenas como carnes magras, huevos o tofu, y lcteos descremados. El mdico la ayudar a determinar la cantidad de peso que puede aumentar.  No coma carne cruda ni quesos sin cocinar. Estos elementos contienen grmenes que pueden causar defectos congnitos en el beb.  Si no consume muchos alimentos con calcio, hable con su mdico sobre si debera tomar un suplemento diario de calcio.  Limite el consumo de alimentos con alto contenido de grasas y azcares procesados, como alimentos fritos o dulces.  Para evitar el estreimiento: ? Bebe suficiente lquido para mantener la orina clara o de color amarillo plido. ? Consuma alimentos ricos en fibra, como frutas y verduras frescas, cereales integrales y frijoles. Actividad  Haga ejercicio solamente como se lo haya indicado el mdico. La mayora de las mujeres pueden continuar su rutina de ejercicios durante el embarazo. Intente realizar como mnimo 30minutos de actividad fsica por lo menos 5das a la semana. Deje de hacer ejercicio si experimenta contracciones uterinas.  No levante  objetos pesados, use zapatos de tacones bajos y mantenga una buena postura.  Puede seguir manteniendo relaciones sexuales, a menos que el mdico le indique lo contrario. Alivio del dolor y del malestar  Use un sostn que le brinde buen soporte para prevenir las molestias causadas por la sensibilidad en los pechos.  Dese baos de asiento con agua tibia para aliviar el dolor o las molestias causadas por las hemorroides. Use una crema para las hemorroides si el mdico la autoriza.  Descanse con las piernas elevadas si tiene calambres o dolor de cintura.  Si tiene venas varicosas, use medias de descanso. Eleve los pies durante 15minutos, 3 o 4veces por da. Limite el consumo de sal en su dieta. Cuidados prenatales  Escriba sus preguntas. Llvelas cuando concurra a las visitas prenatales.  Concurra a todas las visitas prenatales tal como se lo haya indicado el mdico. Esto es importante. Seguridad  Use el cinturn de seguridad en todo momento mientras conduce.  Haga una lista de los nmeros de telfono de emergencia, que incluya los nmeros de telfono de familiares, amigos, el hospital y los departamentos de polica y bomberos. Instrucciones generales  Pdale al mdico que la derive a clases de educacin prenatal en su localidad. Debe comenzar a tomar las clases antes de que empiece el mes6 de embarazo.  Pida ayuda si tiene necesidades nutricionales o de asesoramiento durante el embarazo. El mdico puede aconsejarla o derivarla a especialistas para que la   ayuden con diferentes necesidades.  No se d baos de inmersin en agua caliente, baos turcos ni saunas.  No se haga duchas vaginales ni use tampones o toallas higinicas perfumadas.  No mantenga las piernas cruzadas durante mucho tiempo.  Evite el contacto con las bandejas sanitarias de los gatos y la tierra que estos animales usan. Estos elementos contienen bacterias que pueden causar defectos congnitos al beb y la posible  prdida del feto debido a un aborto espontneo o muerte fetal.  Evite fumar, consumir hierbas, beber alcohol y tomar frmacos que no le hayan recetado. Las sustancias qumicas que estos productos contienen pueden afectar la formacin y el desarrollo del beb.  No consuma ningn producto que contenga nicotina o tabaco, como cigarrillos y cigarrillos electrnicos. Si necesita ayuda para dejar de fumar, consulte al mdico.  Visite a su dentista si an no lo ha hecho durante el embarazo. Use un cepillo de dientes blando para higienizarse los dientes y psese el hilo dental con suavidad. Comunquese con un mdico si:  Tiene mareos.  Siente clicos leves, presin en la pelvis o dolor persistente en el abdomen.  Tiene nuseas, vmitos o diarrea persistentes.  Observa una secrecin vaginal con mal olor.  Siente dolor al orinar. Solicite ayuda de inmediato si:  Tiene fiebre.  Tiene una prdida de lquido por la vagina.  Tiene sangrado o pequeas prdidas vaginales.  Siente dolor intenso o clicos en el abdomen.  Sube de peso o baja de peso rpidamente.  Tiene dificultad para respirar y siente dolor de pecho.  Sbitamente se le hinchan mucho el rostro, las manos, los tobillos, los pies o las piernas.  No ha sentido los movimientos del beb durante una hora.  Siente un dolor de cabeza intenso que no se alivia al tomar medicamentos.  Nota cambios en la visin. Resumen  El segundo trimestre va desde la semana14 hasta la 27, desde el cuarto hasta el sexto mes. Es tambin una poca en la que el feto se desarrolla rpidamente.  Su organismo atraviesa por muchos cambios durante el embarazo. Estos cambios varan de una mujer a otra.  Evite fumar, consumir hierbas, beber alcohol y tomar frmacos que no le hayan recetado. Estas sustancias qumicas afectan la formacin y el desarrollo de su beb.  No consuma ningn producto que contenga tabaco, lo que incluye cigarrillos, tabaco de mascar  y cigarrillos electrnicos. Si necesita ayuda para dejar de fumar, consulte al mdico.  Comunquese con su mdico si tiene preguntas sobre esto. Concurra a todas las visitas prenatales tal como se lo haya indicado el mdico. Esto es importante. Esta informacin no tiene como fin reemplazar el consejo del mdico. Asegrese de hacerle al mdico cualquier pregunta que tenga. Document Revised: 06/28/2016 Document Reviewed: 06/28/2016 Elsevier Patient Education  2020 Elsevier Inc.  

## 2020-03-07 ENCOUNTER — Other Ambulatory Visit: Payer: Self-pay | Admitting: Advanced Practice Midwife

## 2020-03-07 ENCOUNTER — Other Ambulatory Visit: Payer: Self-pay

## 2020-03-07 ENCOUNTER — Ambulatory Visit: Payer: BC Managed Care – PPO | Attending: Advanced Practice Midwife

## 2020-03-07 DIAGNOSIS — Z3480 Encounter for supervision of other normal pregnancy, unspecified trimester: Secondary | ICD-10-CM

## 2020-04-01 ENCOUNTER — Ambulatory Visit (INDEPENDENT_AMBULATORY_CARE_PROVIDER_SITE_OTHER): Payer: BC Managed Care – PPO | Admitting: Advanced Practice Midwife

## 2020-04-01 ENCOUNTER — Other Ambulatory Visit: Payer: Self-pay

## 2020-04-01 VITALS — BP 111/67 | HR 78 | Wt 172.0 lb

## 2020-04-01 DIAGNOSIS — Z3480 Encounter for supervision of other normal pregnancy, unspecified trimester: Secondary | ICD-10-CM

## 2020-04-01 DIAGNOSIS — Z3A23 23 weeks gestation of pregnancy: Secondary | ICD-10-CM

## 2020-04-01 NOTE — Progress Notes (Signed)
   PRENATAL VISIT NOTE  Subjective:  Kristin Mcdaniel is a 25 y.o. G2P1001 at [redacted]w[redacted]d being seen today for ongoing prenatal care.  She is currently monitored for the following issues for this low-risk pregnancy and has Benign heart murmur; Pilonidal abscess; Acute cystitis without hematuria; Less than [redacted] weeks gestation of pregnancy; Supervision of other normal pregnancy, antepartum; Enlarged lymph nodes in armpit; and Nausea and vomiting in pregnancy on their problem list.  Patient reports no complaints.  Contractions: Not present. Vag. Bleeding: None.  Movement: Present. Denies leaking of fluid.   The following portions of the patient's history were reviewed and updated as appropriate: allergies, current medications, past family history, past medical history, past social history, past surgical history and problem list.   Objective:   Vitals:   04/01/20 0845  BP: 111/67  Pulse: 78  Weight: 172 lb (78 kg)    Fetal Status: Fetal Heart Rate (bpm): 135 Fundal Height: 24 cm Movement: Present     General:  Alert, oriented and cooperative. Patient is in no acute distress.  Skin: Skin is warm and dry. No rash noted.   Cardiovascular: Normal heart rate noted  Respiratory: Normal respiratory effort, no problems with respiration noted  Abdomen: Soft, gravid, appropriate for gestational age.  Pain/Pressure: Absent     Pelvic: Cervical exam deferred        Extremities: Normal range of motion.  Edema: None  Mental Status: Normal mood and affect. Normal behavior. Normal judgment and thought content.   Assessment and Plan:  Pregnancy: G2P1001 at [redacted]w[redacted]d 1. [redacted] weeks gestation of pregnancy   2. Supervision of other normal pregnancy, antepartum --Anticipatory guidance about next visits/weeks of pregnancy given. --next visit in 4 weeks in the office for GTT  Preterm labor symptoms and general obstetric precautions including but not limited to vaginal bleeding, contractions, leaking of fluid and fetal  movement were reviewed in detail with the patient. Please refer to After Visit Summary for other counseling recommendations.   Return in about 4 weeks (around 04/29/2020).  Future Appointments  Date Time Provider Department Center  05/01/2020  8:15 AM Conan Bowens, MD [facility]    Sharen Counter, CNM

## 2020-04-01 NOTE — Patient Instructions (Signed)
https://www.nichd.nih.gov/health/topics/labor-delivery/Pages/default.aspx">  Tercer trimestre de embarazo Third Trimester of Pregnancy  El tercer trimestre de embarazo va desde la semana 28 hasta la semana 40. Esto corresponde a los meses 7 a 9. El tercer trimestre es un perodo en el que el beb en gestacin (feto) crece rpidamente. Hacia el final del noveno mes, el feto mide alrededor de 20pulgadas (45cm) de largo y pesa entre 6 y 10 libras (2.7 y 4.5kg). Cambios en el cuerpo durante el tercer trimestre Durante el tercer trimestre, su cuerpo contina experimentando numerosos cambios. Los cambios varan y generalmente vuelven a la normalidad despus del nacimiento del beb. Cambios fsicos  Seguir aumentando de peso. Es de esperar que aumente entre 25 y 35libras (11 y 16kg) hacia el final del embarazo si inicia el embarazo con un peso normal. Si tiene bajo peso, es de esperar que aumente entre 28 y 40 libras (13 y18 kg), y si tiene sobrepeso, es de esperar que aumente entre 15 y 25 libras (7 y 11kg).  Podrn aparecer las primeras estras en las caderas, el abdomen y las mamas.  Las mamas seguirn creciendo y pueden doler. Un lquido amarillo (calostro) puede salir de sus pechos. Esta es la primera leche que usted produce para su beb.  Tal vez haya cambios en el cabello. Esto cambios pueden incluir su engrosamiento, crecimiento rpido y cambios en la textura. A algunas personas tambin se les cae el cabello durante o despus del embarazo, o tienen el cabello seco o fino.  El ombligo puede salir hacia afuera.  Puede observar que se le hinchan las manos, el rostro o los tobillos. Cambios en la salud  Es posible que tenga acidez estomacal.  Puede sufrir estreimiento.  Puede desarrollar hemorroides.  Puede desarrollar venas hinchadas y abultadas (venas varicosas) en las piernas.  Puede presentar ms dolor en la pelvis, la espalda o los muslos. Esto se debe al aumento de peso y al  aumento de las hormonas que relajan las articulaciones.  Puede presentar un aumento del hormigueo o entumecimiento en las manos, brazos y piernas. La piel de su abdomen tambin puede sentirse entumecida.  Puede sentir que le falta el aire debido a que se expande el tero. Otros cambios  Puede tener necesidad de orinar con ms frecuencia porque el feto baja hacia la pelvis y ejerce presin sobre la vejiga.  Puede tener ms problemas para dormir. Esto puede deberse al tamao de su abdomen, una mayor necesidad de orinar y un aumento en el metabolismo de su cuerpo.  Puede notar que el feto "baja" o lo siente ms bajo, en el abdomen (aligeramiento).  Puede tener un aumento de la secrecin vaginal.  Puede notar que tiene dolor alrededor del hueso plvico a medida que el tero se distiende. Siga estas instrucciones en su casa: Medicamentos  Siga las instrucciones del mdico en relacin con el uso de medicamentos. Durante el embarazo, hay medicamentos que pueden tomarse y otros que no. No tome ningn medicamento a menos que lo haya autorizado el mdico.  Tome vitaminas prenatales que contengan por lo menos 600microgramos (mcg) de cido flico. Comida y bebida  Lleve una dieta saludable que incluya frutas y verduras frescas, cereales integrales, buenas fuentes de protenas como carnes magras, huevos o tofu, y productos lcteos descremados.  Evite la carne cruda y el jugo, la leche y el queso sin pasteurizar. Estos portan grmenes que pueden provocar dao tanto a usted como al beb.  Tome 4 o 5 comidas pequeas en lugar de 3   comidas abundantes al da.  Es posible que tenga que tomar estas medidas para prevenir o tratar el estreimiento: ? Product manager suficiente lquido como para Pharmacologist la orina de color amarillo plido. ? Consumir alimentos ricos en fibra, como frijoles, cereales integrales, y frutas y verduras frescas. ? Limitar el consumo de alimentos ricos en grasa y azcares procesados, como  los alimentos fritos o dulces. Actividad  Haga ejercicio solamente como se lo haya indicado el mdico. La mayora de las personas pueden continuar su actividad fsica habitual durante el [place]. Intente realizar como mnimo de actividad fsica por lo menos 5das a la semana. Deje de hacer ejercicio si experimenta contracciones en el tero.  Deje de hacer ejercicio si le aparecen dolor o clicos en la parte baja del vientre o de la espalda.  Evite levantar pesos [employer].  No haga ejercicio si hace mucho calor o humedad, o si se encuentra a una altitud elevada.  Si lo desea, puede seguir teniendo [employer], salvo que el mdico le indique lo contrario. Alivio del dolor y del [place] pausas frecuentes y descanse con las piernas levantadas (elevadas) si tiene calambres en las piernas o dolor en la parte baja de la espalda.  Dese baos de asiento con agua tibia para Engineer, materials o las molestias causadas por las hemorroides. Use una crema para las hemorroides si el mdico la autoriza.  Use un sujetador que le brinde buen soporte para prevenir las molestias causadas por la sensibilidad en las [place].  Si tiene venas varicosas: ? Use medias de compresin como se lo haya indicado el mdico. ? Eleve los pies durante , 3 o 4veces por da. ? Limite el consumo de sal en su dieta. Seguridad  Hable con su mdico antes de viajar distancias largas.  No se d baos de inmersin en agua caliente, baos turcos ni saunas.  Use el cinturn de seguridad en todo momento mientras conduce o va en auto.  Hable con el mdico si es vctima de [employer] o fsico. Preparacin para el nacimiento Para prepararse para la llegada de su beb:  Tome clases prenatales para entender, Education administrator, y hacer preguntas sobre el [place] de parto y [place].  Visite el hospital y recorra el rea de maternidad.  Compre un asiento de seguridad [employer], y  asegrese de saber cmo instalarlo en su automvil.  Prepare la habitacin o el lugar donde dormir el beb. Asegrese de quitar todas las almohadas y [place] de peluche de la cuna del beb para evitar la asfixia. Indicaciones generales  Evite el contacto con las bandejas sanitarias de los gatos y la tierra que estos animales usan. Estos alimentos contienen bacterias que pueden causar defectos congnitos en el beb. Si tiene Financial controller, pdale a alguien que limpie la caja de arena por usted.  No se haga lavados vaginales ni use tampones. No use toallas higinicas perfumadas.  No consuma ningn producto que contenga nicotina o tabaco, como cigarrillos, cigarrillos electrnicos y tabaco de Theatre manager. Si necesita ayuda para dejar de consumir estos productos, consulte al mdico.  No use ningn remedio a base de hierbas, drogas ilegales o medicamentos que no le hayan sido recetados. Las sustancias qumicas de estos productos pueden daar al beb.  No beba alcohol.  Le realizarn exmenes prenatales ms frecuentes durante el tercer trimestre. Durante una visita prenatal de rutina, el mdico le har un examen fsico, Civil engineer, contracting pruebas y Heritage manager con usted de su salud general. Cumpla con  todas las visitas de seguimiento. Esto es importante. Dnde buscar ms informacin  American Pregnancy Association (Asociacin Estadounidense del Embarazo): americanpregnancy.org  American College of Obstetricians and Gynecologists (Colegio Estadounidense de Obstetras y Gineclogos): acog.org/womens-health/pregnancy?  Office on Women's Health (Oficina para la Salud de la Mujer): womenshealth.gov/pregnancy Comunquese con un mdico si tiene:  Fiebre.  Clicos leves en la pelvis, presin en la pelvis o dolor persistente en la zona abdominal o la parte baja de la espalda.  Vmitos o diarrea.  Secrecin vaginal con mal olor u orina con mal olor.  Dolor al orinar.  Un dolor de cabeza que no desaparece despus de  tomar analgsicos.  Cambios en la visin o ve manchas delante de los ojos. Solicite ayuda de inmediato si:  Rompe la bolsa.  Tiene contracciones regulares separadas por menos de 5minutos.  Tiene sangrado o pequeas prdidas vaginales.  Siente un dolor abdominal intenso.  Tiene dificultad para respirar.  Siente dolor en el pecho.  Sufre episodios de desmayo.  No ha sentido a su beb moverse durante el perodo de tiempo que le indic el mdico.  Tiene dolor, hinchazn o enrojecimiento nuevos en un brazo o una pierna o se produce un aumento de alguno de estos sntomas. Resumen  El tercer trimestre del embarazo comprende desde la semana28 hasta la semana 40 (desde el mes7 hasta el mes9).  Puede tener ms problemas para dormir. Esto puede deberse al tamao de su abdomen, una mayor necesidad de orinar y un aumento en el metabolismo de su cuerpo.  Le realizarn exmenes prenatales ms frecuentes durante el tercer trimestre. Cumpla con todas las visitas de seguimiento. Esto es importante. Esta informacin no tiene como fin reemplazar el consejo del mdico. Asegrese de hacerle al mdico cualquier pregunta que tenga. Document Revised: 08/24/2019 Document Reviewed: 08/24/2019 Elsevier Patient Education  2021 Elsevier Inc.  

## 2020-05-01 ENCOUNTER — Encounter: Payer: Self-pay | Admitting: Obstetrics and Gynecology

## 2020-05-01 ENCOUNTER — Ambulatory Visit (INDEPENDENT_AMBULATORY_CARE_PROVIDER_SITE_OTHER): Payer: BC Managed Care – PPO | Admitting: Obstetrics and Gynecology

## 2020-05-01 ENCOUNTER — Other Ambulatory Visit: Payer: Self-pay

## 2020-05-01 VITALS — BP 112/70 | HR 84 | Wt 176.0 lb

## 2020-05-01 DIAGNOSIS — Z3A28 28 weeks gestation of pregnancy: Secondary | ICD-10-CM

## 2020-05-01 DIAGNOSIS — Z3009 Encounter for other general counseling and advice on contraception: Secondary | ICD-10-CM

## 2020-05-01 DIAGNOSIS — Z3480 Encounter for supervision of other normal pregnancy, unspecified trimester: Secondary | ICD-10-CM

## 2020-05-01 NOTE — Progress Notes (Signed)
Declines Tdap today but may get next visit

## 2020-05-01 NOTE — Progress Notes (Signed)
   PRENATAL VISIT NOTE  Subjective:  Kristin Mcdaniel is a 25 y.o. G2P1001 at [redacted]w[redacted]d being seen today for ongoing prenatal care.  She is currently monitored for the following issues for this low-risk pregnancy and has Benign heart murmur; Pilonidal abscess; Acute cystitis without hematuria; Less than [redacted] weeks gestation of pregnancy; Supervision of other normal pregnancy, antepartum; Enlarged lymph nodes in armpit; Nausea and vomiting in pregnancy; and Unwanted fertility on their problem list.  Patient reports no complaints.  Contractions: Not present. Vag. Bleeding: None.  Movement: Present. Denies leaking of fluid.   The following portions of the patient's history were reviewed and updated as appropriate: allergies, current medications, past family history, past medical history, past social history, past surgical history and problem list.   Objective:   Vitals:   05/01/20 0822  BP: 112/70  Pulse: 84  Weight: 176 lb (79.8 kg)    Fetal Status: Fetal Heart Rate (bpm): 143 Fundal Height: 29 cm Movement: Present     General:  Alert, oriented and cooperative. Patient is in no acute distress.  Skin: Skin is warm and dry. No rash noted.   Cardiovascular: Normal heart rate noted  Respiratory: Normal respiratory effort, no problems with respiration noted  Abdomen: Soft, gravid, appropriate for gestational age.  Pain/Pressure: Absent     Pelvic: Cervical exam deferred        Extremities: Normal range of motion.  Edema: None  Mental Status: Normal mood and affect. Normal behavior. Normal judgment and thought content.   Assessment and Plan:  Pregnancy: G2P1001 at [redacted]w[redacted]d  1. [redacted] weeks gestation of pregnancy - 2Hr GTT w/ 1 Hr Carpenter 75 g - HIV antibody (with reflex) - CBC - RPR  2. Supervision of other normal pregnancy, antepartum Husband translated what patient did not understand - they are planning to travel to [place] in a few weeks, reviewed precautions  3. Unwanted fertility Reviewed  BTL, risks/benefits She would like to proceed for now   Preterm labor symptoms and general obstetric precautions including but not limited to vaginal bleeding, contractions, leaking of fluid and fetal movement were reviewed in detail with the patient. Please refer to After Visit Summary for other counseling recommendations.   Return in about 2 weeks (around 05/15/2020) for low OB, in person.  No future appointments.  Conan Bowens, MD

## 2020-05-02 LAB — CBC
HCT: 32.9 % — ABNORMAL LOW (ref 35.0–45.0)
Hemoglobin: 11.2 g/dL — ABNORMAL LOW (ref 11.7–15.5)
MCH: 29.8 pg (ref 27.0–33.0)
MCHC: 34 g/dL (ref 32.0–36.0)
MCV: 87.5 fL (ref 80.0–100.0)
MPV: 12.3 fL (ref 7.5–12.5)
Platelets: 192 10*3/uL (ref 140–400)
RBC: 3.76 10*6/uL — ABNORMAL LOW (ref 3.80–5.10)
RDW: 13.1 % (ref 11.0–15.0)
WBC: 7 10*3/uL (ref 3.8–10.8)

## 2020-05-02 LAB — 2HR GTT W 1 HR, CARPENTER, 75 G
Glucose, 1 Hr, Gest: 136 mg/dL (ref 65–179)
Glucose, 2 Hr, Gest: 89 mg/dL (ref 65–152)
Glucose, Fasting, Gest: 83 mg/dL (ref 65–91)

## 2020-05-02 LAB — HIV ANTIBODY (ROUTINE TESTING W REFLEX): HIV 1&2 Ab, 4th Generation: NONREACTIVE

## 2020-05-02 LAB — RPR: RPR Ser Ql: NONREACTIVE

## 2020-05-13 ENCOUNTER — Ambulatory Visit (INDEPENDENT_AMBULATORY_CARE_PROVIDER_SITE_OTHER): Payer: BC Managed Care – PPO | Admitting: Certified Nurse Midwife

## 2020-05-13 ENCOUNTER — Other Ambulatory Visit: Payer: Self-pay

## 2020-05-13 ENCOUNTER — Encounter: Payer: Self-pay | Admitting: Certified Nurse Midwife

## 2020-05-13 VITALS — BP 114/72 | HR 88 | Wt 178.0 lb

## 2020-05-13 DIAGNOSIS — Z3A29 29 weeks gestation of pregnancy: Secondary | ICD-10-CM

## 2020-05-13 DIAGNOSIS — Z3480 Encounter for supervision of other normal pregnancy, unspecified trimester: Secondary | ICD-10-CM

## 2020-05-13 NOTE — Patient Instructions (Signed)

## 2020-05-13 NOTE — Progress Notes (Signed)
   PRENATAL VISIT NOTE  Subjective:  Kristin Mcdaniel is a 25 y.o. G2P1001 at [redacted]w[redacted]d being seen today for ongoing prenatal care.  She is currently monitored for the following issues for this low-risk pregnancy and has Benign heart murmur; Pilonidal abscess; Acute cystitis without hematuria; Less than [redacted] weeks gestation of pregnancy; Supervision of other normal pregnancy, antepartum; Enlarged lymph nodes in armpit; Nausea and vomiting in pregnancy; and Unwanted fertility on their problem list.  Patient reports no complaints.  Contractions: Irritability. Vag. Bleeding: None.  Movement: Present. Denies leaking of fluid.   The following portions of the patient's history were reviewed and updated as appropriate: allergies, current medications, past family history, past medical history, past social history, past surgical history and problem list.   Objective:   Vitals:   05/13/20 0838  BP: 114/72  Pulse: 88  Weight: 178 lb (80.7 kg)    Fetal Status: Fetal Heart Rate (bpm): 139 Fundal Height: 29 cm Movement: Present     General:  Alert, oriented and cooperative. Patient is in no acute distress.  Skin: Skin is warm and dry. No rash noted.   Cardiovascular: Normal heart rate noted  Respiratory: Normal respiratory effort, no problems with respiration noted  Abdomen: Soft, gravid, appropriate for gestational age.  Pain/Pressure: Absent     Pelvic: Cervical exam deferred        Extremities: Normal range of motion.  Edema: None  Mental Status: Normal mood and affect. Normal behavior. Normal judgment and thought content.   Assessment and Plan:  Pregnancy: G2P1001 at [redacted]w[redacted]d 1. [redacted] weeks gestation of pregnancy - reviewed GTT and third trimester labs with patient - normal   2. Supervision of other normal pregnancy, antepartum - Routine prenatal care - Anticipatory guidance on upcoming appointments  - Patient is planning on traveling to [place] in the next couple of days, discussed with patient if she  needs travel clearance for flight - patient denies and states they do not need any paperwork   Preterm labor symptoms and general obstetric precautions including but not limited to vaginal bleeding, contractions, leaking of fluid and fetal movement were reviewed in detail with the patient. Please refer to After Visit Summary for other counseling recommendations.   Return in about 2 weeks (around 05/27/2020) for LROB, virtual.  Future Appointments  Date Time Provider Department Center  05/27/2020  9:10 AM Rasch, Harolyn Rutherford, NP [facility]    Sharyon Cable, CNM

## 2020-05-15 ENCOUNTER — Telehealth: Payer: Self-pay | Admitting: *Deleted

## 2020-05-15 NOTE — Telephone Encounter (Signed)
Patient's husband was placed on hold at 3:41 PM, but hung up. Returned call at 3:44 PM and left a message to call the office back.

## 2020-05-27 ENCOUNTER — Telehealth (INDEPENDENT_AMBULATORY_CARE_PROVIDER_SITE_OTHER): Payer: BC Managed Care – PPO | Admitting: Obstetrics and Gynecology

## 2020-05-27 DIAGNOSIS — Z3480 Encounter for supervision of other normal pregnancy, unspecified trimester: Secondary | ICD-10-CM

## 2020-05-27 DIAGNOSIS — Z3A31 31 weeks gestation of pregnancy: Secondary | ICD-10-CM

## 2020-05-27 DIAGNOSIS — Z3483 Encounter for supervision of other normal pregnancy, third trimester: Secondary | ICD-10-CM

## 2020-05-27 NOTE — Progress Notes (Signed)
Pt has not taken BP/weight

## 2020-05-27 NOTE — Progress Notes (Signed)
    TELEHEALTH OBSTETRICS VISIT ENCOUNTER NOTE  Provider location: Center for [facility] [place]   Patient location: Home  I connected with Kristin Mcdaniel on 05/27/20 at  9:10 AM EDT by video at home and verified that I am speaking with the correct person using two identifiers.    I discussed the limitations, risks, security and privacy concerns of performing an evaluation and management service by telephone and the availability of in person appointments. I also discussed with the patient that there may be a patient responsible charge related to this service. The patient expressed understanding and agreed to proceed.  Subjective:  Kristin Mcdaniel is a 25 y.o. G2P1001 at [redacted]w[redacted]d being followed for ongoing prenatal care.  She is currently monitored for the following issues for this low-risk pregnancy and has Benign heart murmur; Pilonidal abscess; Acute cystitis without hematuria; Supervision of other normal pregnancy, antepartum; Enlarged lymph nodes in armpit; Nausea and vomiting in pregnancy; and Unwanted fertility on their problem list.  Patient reports no complaints. Reports fetal movement. Denies any contractions, bleeding or leaking of fluid.   The following portions of the patient's history were reviewed and updated as appropriate: allergies, current medications, past family history, past medical history, past social history, past surgical history and problem list.   Objective:  Last menstrual period 10/17/2019. General:  Alert, oriented and cooperative.   Mental Status: Normal mood and affect perceived. Normal judgment and thought content.  Rest of physical exam deferred due to type of encounter  Assessment and Plan:  Pregnancy: G2P1001 at [redacted]w[redacted]d 1. Supervision of other normal pregnancy, antepartum  - Patient did not check her BP today. She will check it and log it later.  -Doing well - Partner Kristin Mcdaniel present.  - Discussed [facility] and where delivery will  be.   Preterm labor symptoms and general obstetric precautions including but not limited to vaginal bleeding, contractions, leaking of fluid and fetal movement were reviewed in detail with the patient.  I discussed the assessment and treatment plan with the patient. The patient was provided an opportunity to ask questions and all were answered. The patient agreed with the plan and demonstrated an understanding of the instructions. The patient was advised to call back or seek an in-person office evaluation/go to MAU at [facility] for any urgent or concerning symptoms. Please refer to After Visit Summary for other counseling recommendations.   I provided 8 minutes of non-face-to-face time during this encounter.  Return in about 2 weeks (around 06/10/2020) for In person visit. Marland Kitchen  No future appointments.  Venia Carbon, NP Center for [employer], [facility]

## 2020-06-10 ENCOUNTER — Other Ambulatory Visit: Payer: Self-pay

## 2020-06-10 ENCOUNTER — Ambulatory Visit (INDEPENDENT_AMBULATORY_CARE_PROVIDER_SITE_OTHER): Payer: BC Managed Care – PPO

## 2020-06-10 VITALS — BP 110/68 | HR 84 | Wt 181.0 lb

## 2020-06-10 DIAGNOSIS — Z3A33 33 weeks gestation of pregnancy: Secondary | ICD-10-CM

## 2020-06-10 DIAGNOSIS — Z23 Encounter for immunization: Secondary | ICD-10-CM

## 2020-06-10 DIAGNOSIS — Z3480 Encounter for supervision of other normal pregnancy, unspecified trimester: Secondary | ICD-10-CM

## 2020-06-10 DIAGNOSIS — Z3009 Encounter for other general counseling and advice on contraception: Secondary | ICD-10-CM

## 2020-06-10 NOTE — Patient Instructions (Signed)
Safe Medications in Pregnancy   Acne: Benzoyl Peroxide Salicylic Acid  Backache/Headache: Tylenol: 2 regular strength every 4 hours OR              2 Extra strength every 6 hours  Colds/Coughs/Allergies: Benadryl (alcohol free) 25 mg every 6 hours as needed Breath right strips Claritin Cepacol throat lozenges Chloraseptic throat spray Cold-Eeze- up to three times per day Cough drops, alcohol free Flonase (by prescription only) Guaifenesin Mucinex Robitussin DM (plain only, alcohol free) Saline nasal spray/drops Sudafed (pseudoephedrine) & Actifed ** use only after [redacted] weeks gestation and if you do not have high blood pressure Tylenol Vicks Vaporub Zinc lozenges Zyrtec   Constipation: Colace Ducolax suppositories Fleet enema Glycerin suppositories Metamucil Milk of magnesia Miralax Senokot Smooth move tea  Diarrhea: Kaopectate Imodium A-D  *NO pepto Bismol  Hemorrhoids: Anusol Anusol HC Preparation H Tucks  Indigestion: Tums Maalox Mylanta Zantac  Pepcid  Insomnia: Benadryl (alcohol free) 25mg  every 6 hours as needed Tylenol PM Unisom, no Gelcaps  Leg Cramps: Tums MagGel  Nausea/Vomiting:  Bonine Dramamine Emetrol Ginger extract Sea bands Meclizine  Nausea medication to take during pregnancy:  Unisom (doxylamine succinate 25 mg tablets) Take one tablet daily at bedtime. If symptoms are not adequately controlled, the dose can be increased to a maximum recommended dose of two tablets daily (1/2 tablet in the morning, 1/2 tablet mid-afternoon and one at bedtime). Vitamin B6 100mg  tablets. Take one tablet twice a day (up to 200 mg per day).  Skin Rashes: Aveeno products Benadryl cream or 25mg  every 6 hours as needed Calamine Lotion 1% cortisone cream  Yeast infection: Gyne-lotrimin 7 Monistat 7   **If taking multiple medications, please check labels to avoid duplicating the same active ingredients **take medication as directed on  the label ** Do not exceed 4000 mg of tylenol in 24 hours **Do not take medications that contain aspirin or ibuprofen     Group B Streptococcus Infection During Pregnancy Group B Streptococcus (GBS) is a type of bacteria that is often found in healthy people. It is commonly found in the rectum, vagina, and intestines. In people who are healthy and not pregnant, the bacteria rarely cause serious illness or complications. However, women who test positive for GBS during pregnancy can pass the bacteria to the baby during childbirth. This can cause serious infection in the baby after birth. Women with GBS may also have infections during their pregnancy or soon after childbirth. The infections include urinary tract infections (UTIs) or infections of the uterus. GBS also increases a woman's risk of complications during pregnancy, such as early labor or delivery, miscarriage, or stillbirth. Routine testing for GBS is recommended for all pregnant women. What are the causes? This condition is caused by bacteria called Streptococcus agalactiae. What increases the risk? You may have a higher risk for GBS infection during pregnancy if you had one during a past pregnancy. What are the signs or symptoms? In most cases, GBS infection does not cause symptoms in pregnant women. If symptoms exist, they may include:  Labor that starts before the 37th week of pregnancy.  A UTI or bladder infection. This may cause a fever, frequent urination, or pain and burning during urination.  Fever during labor. There can also be a rapid heartbeat in the mother or baby. Rare but serious symptoms of a GBS infection in women include:  Blood infection (septicemia). This may cause fever, chills, or confusion.  Lung infection (pneumonia). This may cause fever, chills, cough,  rapid breathing, chest pain, or difficulty breathing.  Bone, joint, skin, or soft tissue infection. How is this diagnosed? You may be screened for GBS  between week 35 and week 37 of pregnancy. If you have symptoms of preterm labor, you may be screened earlier. This condition is diagnosed based on lab test results from:  A swab of fluid from the vagina and rectum.  A urine sample. How is this treated? This condition is treated with antibiotic medicine. Antibiotic medicine may be given:  To you when you go into labor, or as soon as your water breaks. The medicines will continue until after you give birth. If you are having a cesarean delivery, you do not need antibiotics unless your water has broken.  To your baby, if he or she requires treatment. Your health care provider will check your baby to decide if he or she needs antibiotics to prevent a serious infection.   Follow these instructions at home:  Take over-the-counter and prescription medicines only as told by your health care provider.  Take your antibiotic medicine as told by your health care provider. Do not stop taking the antibiotic even if you start to feel better.  Keep all pre-birth (prenatal) visits and follow-up visits as told by your health care provider. This is important. Contact a health care provider if:  You have pain or burning when you urinate.  You have to urinate more often than usual.  You have a fever or chills.  You develop a bad-smelling vaginal discharge. Get help right away if:  Your water breaks.  You go into labor.  You have severe pain in your abdomen.  You have difficulty breathing.  You have chest pain. These symptoms may represent a serious problem that is an emergency. Do not wait to see if the symptoms will go away. Get medical help right away. Call your local emergency services (911 in the [place] Do not drive yourself to the hospital. Summary  GBS is a type of bacteria that is common in healthy people.  During pregnancy, colonization with GBS can cause serious complications for you or your baby.  Your health care provider will  screen you between 35 and 37 weeks of pregnancy to determine if you are colonized with GBS.  If you are colonized with GBS during pregnancy, your health care provider will recommend antibiotics through an IV during labor.  After delivery, your baby will be evaluated for complications related to potential GBS infection and may require antibiotics to prevent a serious infection. This information is not intended to replace advice given to you by your health care provider. Make sure you discuss any questions you have with your health care provider. Document Revised: 12/18/2019 Document Reviewed: 09/11/2018 Elsevier Patient Education  2021 Elsevier Inc.  Rosen's Emergency Medicine: Concepts and Clinical Practice (9th ed., pp. 2296- 2312). Elsevier.">  Braxton Hicks Contractions Contractions of the uterus can occur throughout pregnancy, but they are not always a sign that you are in labor. You may have practice contractions called Braxton Hicks contractions. These false labor contractions are sometimes confused with true labor. What are Deberah Pelton contractions? Braxton Hicks contractions are tightening movements that occur in the muscles of the uterus before labor. Unlike true labor contractions, these contractions do not result in opening (dilation) and thinning of the cervix. Toward the end of pregnancy (32-34 weeks), Braxton Hicks contractions can happen more often and may become stronger. These contractions are sometimes difficult to tell apart from true labor  because they can be very uncomfortable. You should not feel embarrassed if you go to the hospital with false labor. Sometimes, the only way to tell if you are in true labor is for your health care provider to look for changes in the cervix. The health care provider will do a physical exam and may monitor your contractions. If you are not in true labor, the exam should show that your cervix is not dilating and your water has not broken. If  there are no other health problems associated with your pregnancy, it is completely safe for you to be sent home with false labor. You may continue to have Braxton Hicks contractions until you go into true labor. How to tell the difference between true labor and false labor True labor  Contractions last 30-70 seconds.  Contractions become very regular.  Discomfort is usually felt in the top of the uterus, and it spreads to the lower abdomen and low back.  Contractions do not go away with walking.  Contractions usually become more intense and increase in frequency.  The cervix dilates and gets thinner. False labor  Contractions are usually shorter and not as strong as true labor contractions.  Contractions are usually irregular.  Contractions are often felt in the front of the lower abdomen and in the groin.  Contractions may go away when you walk around or change positions while lying down.  Contractions get weaker and are shorter-lasting as time goes on.  The cervix usually does not dilate or become thin. Follow these instructions at home:  Take over-the-counter and prescription medicines only as told by your health care provider.  Keep up with your usual exercises and follow other instructions from your health care provider.  Eat and drink lightly if you think you are going into labor.  If Braxton Hicks contractions are making you uncomfortable: ? Change your position from lying down or resting to walking, or change from walking to resting. ? Sit and rest in a tub of warm water. ? Drink enough fluid to keep your urine pale yellow. Dehydration may cause these contractions. ? Do slow and deep breathing several times an hour.  Keep all follow-up prenatal visits as told by your health care provider. This is important.   Contact a health care provider if:  You have a fever.  You have continuous pain in your abdomen. Get help right away if:  Your contractions become  stronger, more regular, and closer together.  You have fluid leaking or gushing from your vagina.  You pass blood-tinged mucus (bloody show).  You have bleeding from your vagina.  You have low back pain that you never had before.  You feel your baby's head pushing down and causing pelvic pressure.  Your baby is not moving inside you as much as it used to. Summary  Contractions that occur before labor are called Braxton Hicks contractions, false labor, or practice contractions.  Braxton Hicks contractions are usually shorter, weaker, farther apart, and less regular than true labor contractions. True labor contractions usually become progressively stronger and regular, and they become more frequent.  Manage discomfort from [facility] contractions by changing position, resting in a warm bath, drinking plenty of water, or practicing deep breathing. This information is not intended to replace advice given to you by your health care provider. Make sure you discuss any questions you have with your health care provider. Document Revised: 01/28/2017 Document Reviewed: 07/01/2016 Elsevier Patient Education  2021 [employer].

## 2020-06-10 NOTE — Progress Notes (Signed)
   PRENATAL VISIT NOTE  Subjective:  Kristin Mcdaniel is a 25 y.o. G2P1001 at [redacted]w[redacted]d being seen today for ongoing prenatal care.  She is currently monitored for the following issues for this low-risk pregnancy and has Benign heart murmur; Pilonidal abscess; Acute cystitis without hematuria; Supervision of other normal pregnancy, antepartum; Enlarged lymph nodes in armpit; Nausea and vomiting in pregnancy; and Unwanted fertility on their problem list.  Patient reports no complaints.  Contractions: Irritability. Vag. Bleeding: None.  Movement: Present. Denies leaking of fluid.   The following portions of the patient's history were reviewed and updated as appropriate: allergies, current medications, past family history, past medical history, past social history, past surgical history and problem list.   Objective:   Vitals:   06/10/20 0834  BP: 110/68  Pulse: 84  Weight: 181 lb (82.1 kg)    Fetal Status: Fetal Heart Rate (bpm): 133 Fundal Height: 33 cm Movement: Present     General:  Alert, oriented and cooperative. Patient is in no acute distress.  Skin: Skin is warm and dry. No rash noted.   Cardiovascular: Normal heart rate noted  Respiratory: Normal respiratory effort, no problems with respiration noted  Abdomen: Soft, gravid, appropriate for gestational age.  Pain/Pressure: Present     Pelvic: Cervical exam deferred        Extremities: Normal range of motion.  Edema: Trace  Mental Status: Normal mood and affect. Normal behavior. Normal judgment and thought content.   Assessment and Plan:  Pregnancy: G2P1001 at [redacted]w[redacted]d 1. Supervision of other normal pregnancy, antepartum -No complaints, routine care -Tdap today -Anticipatory guidance for next visit including GBS testing. -Reviewed address and entrance for [facility]  2. [redacted] weeks gestation of pregnancy   3. Unwanted fertility -Planning BTL  Preterm labor symptoms and general obstetric precautions including but not limited to vaginal  bleeding, contractions, leaking of fluid and fetal movement were reviewed in detail with the patient. Please refer to After Visit Summary for other counseling recommendations.   Return in about 2 weeks (around 06/24/2020) for Return OB visit.  Future Appointments  Date Time Provider Department Center  06/27/2020  9:50 AM Rasch, Harolyn Rutherford, NP [facility]    Rolm Bookbinder, CNM

## 2020-06-27 ENCOUNTER — Ambulatory Visit (INDEPENDENT_AMBULATORY_CARE_PROVIDER_SITE_OTHER): Payer: BC Managed Care – PPO | Admitting: Obstetrics and Gynecology

## 2020-06-27 ENCOUNTER — Other Ambulatory Visit: Payer: Self-pay

## 2020-06-27 ENCOUNTER — Other Ambulatory Visit (HOSPITAL_COMMUNITY)
Admission: RE | Admit: 2020-06-27 | Discharge: 2020-06-27 | Disposition: A | Discharge status: Home / Self Care | Payer: BC Managed Care – PPO | Source: Ambulatory Visit | Attending: Obstetrics and Gynecology | Admitting: Obstetrics and Gynecology

## 2020-06-27 VITALS — BP 121/70 | HR 82 | Wt 183.0 lb

## 2020-06-27 DIAGNOSIS — Z3480 Encounter for supervision of other normal pregnancy, unspecified trimester: Secondary | ICD-10-CM | POA: Insufficient documentation

## 2020-06-27 NOTE — Patient Instructions (Signed)
Cervical Ripening (to get your cervix ready for labor) : May try one or all:  Red Raspberry Leaf capsules:  two 300mg  or 400mg  tablets with each meal, 2-3 times a day  Potential Side Effects Of Raspberry Leaf:  Most women do not experience any side effects from drinking raspberry leaf tea. However, nausea and loose stools are possible     Evening Primrose Oil capsules: may take 1 to 3 capsules daily. May also prick one to release the oil and insert it into your vagina at night.  Some of the potential side effects:  Upset stomach  Loose stools or diarrhea  Headaches  Nausea   6 Dates a day (may taste better if warmed in microwave until soft). Found where raisins are in the grocery store  SEX

## 2020-06-27 NOTE — Progress Notes (Signed)
   PRENATAL VISIT NOTE  Subjective:  Kristin Mcdaniel is a 25 y.o. G2P1001 at [redacted]w[redacted]d being seen today for ongoing prenatal care.  She is currently monitored for the following issues for this low-risk pregnancy and has Benign heart murmur; Pilonidal abscess; Acute cystitis without hematuria; Supervision of other normal pregnancy, antepartum; Enlarged lymph nodes in armpit; Nausea and vomiting in pregnancy; and Unwanted fertility on their problem list.  Patient reports no complaints.  Contractions: Not present. Vag. Bleeding: None.  Movement: Present. Denies leaking of fluid.   The following portions of the patient's history were reviewed and updated as appropriate: allergies, current medications, past family history, past medical history, past social history, past surgical history and problem list.   Objective:   Vitals:   06/27/20 1002  BP: 121/70  Pulse: 82  Weight: 183 lb (83 kg)    Fetal Status: Fetal Heart Rate (bpm): 142 Fundal Height: 36 cm Movement: Present  Presentation: Vertex  General:  Alert, oriented and cooperative. Patient is in no acute distress.  Skin: Skin is warm and dry. No rash noted.   Cardiovascular: Normal heart rate noted  Respiratory: Normal respiratory effort, no problems with respiration noted  Abdomen: Soft, gravid, appropriate for gestational age.  Pain/Pressure: Present     Pelvic: Cervical exam performed in the presence of a chaperone Dilation: 1 Effacement (%): 50 Station: Ballotable  Extremities: Normal range of motion.  Edema: Trace  Mental Status: Normal mood and affect. Normal behavior. Normal judgment and thought content.   Assessment and Plan:  Pregnancy: G2P1001 at [redacted]w[redacted]d 1. Supervision of other normal pregnancy, antepartum  - Discussed weekly visits - Discussed reasons to seek care in MAU. - Culture, beta strep (group b only) - Cervicovaginal ancillary only( [place])  Preterm labor symptoms and general obstetric precautions including but  not limited to vaginal bleeding, contractions, leaking of fluid and fetal movement were reviewed in detail with the patient. Please refer to After Visit Summary for other counseling recommendations.   Return in about 1 week (around 07/04/2020).  No future appointments.  Venia Carbon, NP

## 2020-06-29 LAB — CULTURE, BETA STREP (GROUP B ONLY)
MICRO NUMBER:: 11831374
SPECIMEN QUALITY:: ADEQUATE

## 2020-06-29 LAB — OB RESULTS CONSOLE GBS: GBS: POSITIVE

## 2020-06-30 ENCOUNTER — Encounter: Payer: Self-pay | Admitting: Obstetrics and Gynecology

## 2020-06-30 DIAGNOSIS — B951 Streptococcus, group B, as the cause of diseases classified elsewhere: Secondary | ICD-10-CM | POA: Insufficient documentation

## 2020-06-30 LAB — CERVICOVAGINAL ANCILLARY ONLY
Chlamydia: NEGATIVE
Comment: NEGATIVE
Comment: NORMAL
Neisseria Gonorrhea: NEGATIVE

## 2020-07-04 ENCOUNTER — Other Ambulatory Visit: Payer: Self-pay

## 2020-07-04 ENCOUNTER — Ambulatory Visit (INDEPENDENT_AMBULATORY_CARE_PROVIDER_SITE_OTHER): Payer: BC Managed Care – PPO | Admitting: Obstetrics and Gynecology

## 2020-07-04 VITALS — BP 120/72 | HR 82 | Wt 185.0 lb

## 2020-07-04 DIAGNOSIS — Z3480 Encounter for supervision of other normal pregnancy, unspecified trimester: Secondary | ICD-10-CM

## 2020-07-04 NOTE — Progress Notes (Signed)
   PRENATAL VISIT NOTE  Subjective:  Kristin Mcdaniel is a 24 y.o. G2P1001 at [redacted]w[redacted]d being seen today for ongoing prenatal care.  She is currently monitored for the following issues for this low-risk pregnancy and has Benign heart murmur; Pilonidal abscess; Acute cystitis without hematuria; Supervision of other normal pregnancy, antepartum; Enlarged lymph nodes in armpit; Nausea and vomiting in pregnancy; Unwanted fertility; and Positive GBS test on their problem list.  Patient reports no complaints.  Contractions: Irritability. Vag. Bleeding: None.  Movement: Present. Denies leaking of fluid.   The following portions of the patient's history were reviewed and updated as appropriate: allergies, current medications, past family history, past medical history, past social history, past surgical history and problem list.   Objective:   Vitals:   07/04/20 1103  BP: 120/72  Pulse: 82  Weight: 185 lb (83.9 kg)    Fetal Status: Fetal Heart Rate (bpm): 147   Movement: Present     General:  Alert, oriented and cooperative. Patient is in no acute distress.  Skin: Skin is warm and dry. No rash noted.   Cardiovascular: Normal heart rate noted  Respiratory: Normal respiratory effort, no problems with respiration noted  Abdomen: Soft, gravid, appropriate for gestational age.  Pain/Pressure: Present     Pelvic: Cervical exam deferred        Extremities: Normal range of motion.  Edema: Trace  Mental Status: Normal mood and affect. Normal behavior. Normal judgment and thought content.   Assessment and Plan:  Pregnancy: G2P1001 at [redacted]w[redacted]d   1. Supervision of other normal pregnancy, antepartum  Thinks she lost her mucus plug BP good today Declined cervical exam/pelvic   Term labor symptoms and general obstetric precautions including but not limited to vaginal bleeding, contractions, leaking of fluid and fetal movement were reviewed in detail with the patient. Please refer to After Visit Summary for  other counseling recommendations.   No follow-ups on file.  No future appointments.  Venia Carbon, NP

## 2020-07-11 ENCOUNTER — Other Ambulatory Visit: Payer: Self-pay

## 2020-07-11 ENCOUNTER — Ambulatory Visit (INDEPENDENT_AMBULATORY_CARE_PROVIDER_SITE_OTHER): Payer: BC Managed Care – PPO | Admitting: Certified Nurse Midwife

## 2020-07-11 VITALS — BP 117/71 | HR 77 | Wt 190.0 lb

## 2020-07-11 DIAGNOSIS — Z3480 Encounter for supervision of other normal pregnancy, unspecified trimester: Secondary | ICD-10-CM

## 2020-07-11 NOTE — Progress Notes (Signed)
Subjective:  Kristin Mcdaniel is a 25 y.o. G2P1001 at [redacted]w[redacted]d being seen today for ongoing prenatal care.  She is currently monitored for the following issues for this low-risk pregnancy and has Benign heart murmur; Pilonidal abscess; Acute cystitis without hematuria; Supervision of other normal pregnancy, antepartum; Enlarged lymph nodes in armpit; Nausea and vomiting in pregnancy; Unwanted fertility; and Positive GBS test on their problem list.  Patient reports backache and pelvic pressure.  Contractions: Irritability. Vag. Bleeding: None.  Movement: Present. Denies leaking of fluid.   The following portions of the patient's history were reviewed and updated as appropriate: allergies, current medications, past family history, past medical history, past social history, past surgical history and problem list. Problem list updated.  Objective:   Vitals:   07/11/20 1057  BP: 117/71  Pulse: 77  Weight: 190 lb (86.2 kg)    Fetal Status: Fetal Heart Rate (bpm): 141 Fundal Height: 38 cm Movement: Present  Presentation: Vertex  General:  Alert, oriented and cooperative. Patient is in no acute distress.  Skin: Skin is warm and dry. No rash noted.   Cardiovascular: Normal heart rate noted  Respiratory: Normal respiratory effort, no problems with respiration noted  Abdomen: Soft, gravid, appropriate for gestational age. Pain/Pressure: Present     Pelvic: Vag. Bleeding: None Vag D/C Character: Thin   Cervical exam performed Dilation: 1 Effacement (%): 50 Station: -3  Extremities: Normal range of motion.  Edema: Trace  Mental Status: Normal mood and affect. Normal behavior. Normal judgment and thought content.   Urinalysis:      Assessment and Plan:  Pregnancy: G2P1001 at [redacted]w[redacted]d  1. Supervision of other normal pregnancy, antepartum - Tylenol/heat prn for pain  Term labor symptoms and general obstetric precautions including but not limited to vaginal bleeding, contractions, leaking of fluid and  fetal movement were reviewed in detail with the patient. Please refer to After Visit Summary for other counseling recommendations.  Return in about 1 week (around 07/18/2020).   Donette Larry, CNM

## 2020-07-11 NOTE — Patient Instructions (Signed)

## 2020-07-18 ENCOUNTER — Inpatient Hospital Stay (HOSPITAL_COMMUNITY): Payer: BC Managed Care – PPO | Admitting: Anesthesiology

## 2020-07-18 ENCOUNTER — Encounter (HOSPITAL_COMMUNITY)
Admission: AD | Disposition: A | Discharge status: Home / Self Care | Payer: Self-pay | Source: Home / Self Care | Attending: Obstetrics and Gynecology

## 2020-07-18 ENCOUNTER — Other Ambulatory Visit: Payer: Self-pay

## 2020-07-18 ENCOUNTER — Encounter (HOSPITAL_COMMUNITY): Payer: Self-pay | Admitting: Obstetrics & Gynecology

## 2020-07-18 ENCOUNTER — Inpatient Hospital Stay (HOSPITAL_COMMUNITY)
Admission: AD | Admit: 2020-07-18 | Discharge: 2020-07-19 | DRG: 798 | Disposition: A | Discharge status: Home / Self Care | Payer: BC Managed Care – PPO | Attending: Obstetrics and Gynecology | Admitting: Obstetrics and Gynecology

## 2020-07-18 DIAGNOSIS — O99820 Streptococcus B carrier state complicating pregnancy: Secondary | ICD-10-CM

## 2020-07-18 DIAGNOSIS — Z20822 Contact with and (suspected) exposure to covid-19: Secondary | ICD-10-CM | POA: Diagnosis present

## 2020-07-18 DIAGNOSIS — Z302 Encounter for sterilization: Secondary | ICD-10-CM | POA: Diagnosis not present

## 2020-07-18 DIAGNOSIS — B951 Streptococcus, group B, as the cause of diseases classified elsewhere: Secondary | ICD-10-CM

## 2020-07-18 DIAGNOSIS — Z3480 Encounter for supervision of other normal pregnancy, unspecified trimester: Secondary | ICD-10-CM

## 2020-07-18 DIAGNOSIS — Z3A39 39 weeks gestation of pregnancy: Secondary | ICD-10-CM

## 2020-07-18 DIAGNOSIS — O99824 Streptococcus B carrier state complicating childbirth: Principal | ICD-10-CM | POA: Diagnosis present

## 2020-07-18 DIAGNOSIS — Z3009 Encounter for other general counseling and advice on contraception: Secondary | ICD-10-CM

## 2020-07-18 DIAGNOSIS — O26893 Other specified pregnancy related conditions, third trimester: Secondary | ICD-10-CM | POA: Diagnosis present

## 2020-07-18 DIAGNOSIS — Z3490 Encounter for supervision of normal pregnancy, unspecified, unspecified trimester: Secondary | ICD-10-CM

## 2020-07-18 HISTORY — PX: TUBAL LIGATION: SHX77

## 2020-07-18 LAB — CBC
HCT: 37.5 % (ref 36.0–46.0)
Hemoglobin: 12.5 g/dL (ref 12.0–15.0)
MCH: 29.3 pg (ref 26.0–34.0)
MCHC: 33.3 g/dL (ref 30.0–36.0)
MCV: 87.8 fL (ref 80.0–100.0)
Platelets: 186 10*3/uL (ref 150–400)
RBC: 4.27 MIL/uL (ref 3.87–5.11)
RDW: 13.8 % (ref 11.5–15.5)
WBC: 9.8 10*3/uL (ref 4.0–10.5)
nRBC: 0 % (ref 0.0–0.2)

## 2020-07-18 LAB — RESP PANEL BY RT-PCR (FLU A&B, COVID) ARPGX2
Influenza A by PCR: NEGATIVE
Influenza B by PCR: NEGATIVE
SARS Coronavirus 2 by RT PCR: NEGATIVE

## 2020-07-18 LAB — TYPE AND SCREEN
ABO/RH(D): O POS
Antibody Screen: NEGATIVE

## 2020-07-18 LAB — RPR: RPR Ser Ql: NONREACTIVE

## 2020-07-18 SURGERY — LIGATION, FALLOPIAN TUBE, POSTPARTUM
Anesthesia: Epidural | Site: Abdomen | Wound class: Clean Contaminated

## 2020-07-18 MED ORDER — SODIUM CHLORIDE 0.9 % IV SOLN: 1.0000 g | Freq: Four times a day (QID) | INTRAVENOUS | Status: DC

## 2020-07-18 MED ORDER — DIPHENHYDRAMINE HCL 50 MG/ML IJ SOLN: 25.0000 mg | Freq: Once | INTRAMUSCULAR | Status: DC

## 2020-07-18 MED ORDER — KETOROLAC TROMETHAMINE 30 MG/ML IJ SOLN
INTRAMUSCULAR | Status: AC
  Filled 2020-07-18: qty 1

## 2020-07-18 MED ORDER — PHENYLEPHRINE 40 MCG/ML (10ML) SYRINGE FOR IV PUSH (FOR BLOOD PRESSURE SUPPORT)
80.0000 ug | PREFILLED_SYRINGE | INTRAVENOUS | Status: DC | PRN
  Filled 2020-07-18: qty 10

## 2020-07-18 MED ORDER — LACTATED RINGERS IV SOLN: INTRAVENOUS | Status: DC | PRN

## 2020-07-18 MED ORDER — OXYTOCIN-SODIUM CHLORIDE 30-0.9 UT/500ML-% IV SOLN
2.5000 [IU]/h | INTRAVENOUS | Status: DC
  Filled 2020-07-18: qty 500

## 2020-07-18 MED ORDER — FENTANYL CITRATE (PF) 100 MCG/2ML IJ SOLN
INTRAMUSCULAR | Status: AC
  Filled 2020-07-18: qty 2

## 2020-07-18 MED ORDER — TETANUS-DIPHTH-ACELL PERTUSSIS 5-2.5-18.5 LF-MCG/0.5 IM SUSY: 0.5000 mL | PREFILLED_SYRINGE | Freq: Once | INTRAMUSCULAR | Status: DC

## 2020-07-18 MED ORDER — FENTANYL-BUPIVACAINE-NACL 0.5-0.125-0.9 MG/250ML-% EP SOLN
12.0000 mL/h | EPIDURAL | Status: DC | PRN
  Administered 2020-07-18: 10.5 mL/h via EPIDURAL
  Filled 2020-07-18: qty 250

## 2020-07-18 MED ORDER — OXYCODONE HCL 5 MG PO TABS: 5.0000 mg | ORAL_TABLET | Freq: Once | ORAL | Status: DC | PRN

## 2020-07-18 MED ORDER — DIPHENHYDRAMINE HCL 50 MG/ML IJ SOLN
12.5000 mg | INTRAMUSCULAR | Status: DC | PRN
  Administered 2020-07-18: 25 mg via INTRAVENOUS
  Filled 2020-07-18: qty 1

## 2020-07-18 MED ORDER — OXYCODONE-ACETAMINOPHEN 5-325 MG PO TABS: 1.0000 | ORAL_TABLET | ORAL | Status: DC | PRN

## 2020-07-18 MED ORDER — WITCH HAZEL-GLYCERIN EX PADS: 1.0000 "application " | MEDICATED_PAD | CUTANEOUS | Status: DC | PRN

## 2020-07-18 MED ORDER — MIDAZOLAM HCL 2 MG/2ML IJ SOLN
INTRAMUSCULAR | Status: AC
  Filled 2020-07-18: qty 2

## 2020-07-18 MED ORDER — EPHEDRINE 5 MG/ML INJ: 10.0000 mg | INTRAVENOUS | Status: DC | PRN

## 2020-07-18 MED ORDER — PHENYLEPHRINE 40 MCG/ML (10ML) SYRINGE FOR IV PUSH (FOR BLOOD PRESSURE SUPPORT): 80.0000 ug | PREFILLED_SYRINGE | INTRAVENOUS | Status: DC | PRN

## 2020-07-18 MED ORDER — IBUPROFEN 600 MG PO TABS
600.0000 mg | ORAL_TABLET | Freq: Four times a day (QID) | ORAL | Status: DC
Start: 2020-07-18 — End: 2020-07-19
  Administered 2020-07-18 – 2020-07-19 (×4): 600 mg via ORAL
  Filled 2020-07-18 (×4): qty 1

## 2020-07-18 MED ORDER — COCONUT OIL OIL: 1.0000 "application " | TOPICAL_OIL | Status: DC | PRN

## 2020-07-18 MED ORDER — ONDANSETRON HCL 4 MG/2ML IJ SOLN: 4.0000 mg | Freq: Four times a day (QID) | INTRAMUSCULAR | Status: DC | PRN

## 2020-07-18 MED ORDER — SODIUM CHLORIDE 0.9 % IR SOLN
Status: DC | PRN
  Administered 2020-07-18: 1000 mL

## 2020-07-18 MED ORDER — MIDAZOLAM HCL 5 MG/5ML IJ SOLN
INTRAMUSCULAR | Status: DC | PRN
  Administered 2020-07-18 (×2): 1 mg via INTRAVENOUS

## 2020-07-18 MED ORDER — BENZOCAINE-MENTHOL 20-0.5 % EX AERO: 1.0000 "application " | INHALATION_SPRAY | CUTANEOUS | Status: DC | PRN

## 2020-07-18 MED ORDER — DIPHENHYDRAMINE HCL 25 MG PO CAPS: 25.0000 mg | ORAL_CAPSULE | Freq: Four times a day (QID) | ORAL | Status: DC | PRN

## 2020-07-18 MED ORDER — FENTANYL-BUPIVACAINE-NACL 0.5-0.125-0.9 MG/250ML-% EP SOLN: 12.0000 mL/h | EPIDURAL | Status: DC | PRN

## 2020-07-18 MED ORDER — MEPERIDINE HCL 25 MG/ML IJ SOLN: 6.2500 mg | INTRAMUSCULAR | Status: DC | PRN

## 2020-07-18 MED ORDER — BUPIVACAINE HCL (PF) 0.25 % IJ SOLN
INTRAMUSCULAR | Status: DC | PRN
  Administered 2020-07-18: 30 mL

## 2020-07-18 MED ORDER — ACETAMINOPHEN 325 MG PO TABS: 650.0000 mg | ORAL_TABLET | ORAL | Status: DC | PRN

## 2020-07-18 MED ORDER — BUPIVACAINE HCL (PF) 0.25 % IJ SOLN
INTRAMUSCULAR | Status: AC
  Filled 2020-07-18: qty 30

## 2020-07-18 MED ORDER — KETOROLAC TROMETHAMINE 30 MG/ML IJ SOLN
INTRAMUSCULAR | Status: DC | PRN
  Administered 2020-07-18: 30 mg via INTRAVENOUS

## 2020-07-18 MED ORDER — SOD CITRATE-CITRIC ACID 500-334 MG/5ML PO SOLN
30.0000 mL | ORAL | Status: DC | PRN
  Filled 2020-07-18: qty 15

## 2020-07-18 MED ORDER — KETOROLAC TROMETHAMINE 30 MG/ML IJ SOLN: 30.0000 mg | Freq: Once | INTRAMUSCULAR | Status: DC | PRN

## 2020-07-18 MED ORDER — STERILE WATER FOR IRRIGATION IR SOLN
Status: DC | PRN
  Administered 2020-07-18: 1000 mL

## 2020-07-18 MED ORDER — SENNOSIDES-DOCUSATE SODIUM 8.6-50 MG PO TABS
2.0000 | ORAL_TABLET | Freq: Every day | ORAL | Status: DC
  Administered 2020-07-19: 2 via ORAL
  Filled 2020-07-18: qty 2

## 2020-07-18 MED ORDER — PROMETHAZINE HCL 25 MG/ML IJ SOLN: 6.2500 mg | INTRAMUSCULAR | Status: DC | PRN

## 2020-07-18 MED ORDER — ZOLPIDEM TARTRATE 5 MG PO TABS
5.0000 mg | ORAL_TABLET | Freq: Every evening | ORAL | Status: DC | PRN
Start: 2020-07-18 — End: 2020-07-19

## 2020-07-18 MED ORDER — ONDANSETRON HCL 4 MG/2ML IJ SOLN: 4.0000 mg | INTRAMUSCULAR | Status: DC | PRN

## 2020-07-18 MED ORDER — SODIUM CHLORIDE 0.9 % IV SOLN
2.0000 g | Freq: Four times a day (QID) | INTRAVENOUS | Status: DC
  Administered 2020-07-18 (×2): 2 g via INTRAVENOUS
  Filled 2020-07-18 (×2): qty 2000

## 2020-07-18 MED ORDER — SOD CITRATE-CITRIC ACID 500-334 MG/5ML PO SOLN: 30.0000 mL | Freq: Once | ORAL | Status: DC

## 2020-07-18 MED ORDER — PRENATAL MULTIVITAMIN CH
1.0000 | ORAL_TABLET | Freq: Every day | ORAL | Status: DC
  Administered 2020-07-19: 1 via ORAL
  Filled 2020-07-18: qty 1

## 2020-07-18 MED ORDER — ONDANSETRON HCL 4 MG/2ML IJ SOLN
INTRAMUSCULAR | Status: DC | PRN
  Administered 2020-07-18: 4 mg via INTRAVENOUS

## 2020-07-18 MED ORDER — OXYTOCIN BOLUS FROM INFUSION
333.0000 mL | Freq: Once | INTRAVENOUS | Status: AC
  Administered 2020-07-18: 333 mL via INTRAVENOUS

## 2020-07-18 MED ORDER — LIDOCAINE HCL (PF) 1 % IJ SOLN
INTRAMUSCULAR | Status: DC | PRN
  Administered 2020-07-18: 3 mL via EPIDURAL
  Administered 2020-07-18: 4 mL via EPIDURAL

## 2020-07-18 MED ORDER — SIMETHICONE 80 MG PO CHEW: 80.0000 mg | CHEWABLE_TABLET | ORAL | Status: DC | PRN

## 2020-07-18 MED ORDER — FENTANYL CITRATE (PF) 100 MCG/2ML IJ SOLN
INTRAMUSCULAR | Status: DC | PRN
  Administered 2020-07-18: 100 ug via EPIDURAL

## 2020-07-18 MED ORDER — ONDANSETRON HCL 4 MG PO TABS: 4.0000 mg | ORAL_TABLET | ORAL | Status: DC | PRN

## 2020-07-18 MED ORDER — OXYCODONE-ACETAMINOPHEN 5-325 MG PO TABS: 2.0000 | ORAL_TABLET | ORAL | Status: DC | PRN

## 2020-07-18 MED ORDER — FENTANYL CITRATE (PF) 100 MCG/2ML IJ SOLN
INTRAMUSCULAR | Status: DC | PRN
  Administered 2020-07-18 (×2): 50 ug via INTRAVENOUS

## 2020-07-18 MED ORDER — LACTATED RINGERS IV SOLN
500.0000 mL | INTRAVENOUS | Status: DC | PRN
Start: 2020-07-18 — End: 2020-07-18

## 2020-07-18 MED ORDER — OXYCODONE HCL 5 MG/5ML PO SOLN
5.0000 mg | Freq: Once | ORAL | Status: DC | PRN
Start: 2020-07-18 — End: 2020-07-18

## 2020-07-18 MED ORDER — DIPHENHYDRAMINE HCL 50 MG/ML IJ SOLN: 12.5000 mg | INTRAMUSCULAR | Status: DC | PRN

## 2020-07-18 MED ORDER — DIBUCAINE (PERIANAL) 1 % EX OINT: 1.0000 "application " | TOPICAL_OINTMENT | CUTANEOUS | Status: DC | PRN

## 2020-07-18 MED ORDER — LACTATED RINGERS IV SOLN: 500.0000 mL | Freq: Once | INTRAVENOUS | Status: DC

## 2020-07-18 MED ORDER — LIDOCAINE HCL (PF) 1 % IJ SOLN: 30.0000 mL | INTRAMUSCULAR | Status: DC | PRN

## 2020-07-18 MED ORDER — HYDROMORPHONE HCL 1 MG/ML IJ SOLN: 0.2500 mg | INTRAMUSCULAR | Status: DC | PRN

## 2020-07-18 MED ORDER — LACTATED RINGERS IV SOLN: INTRAVENOUS | Status: DC

## 2020-07-18 MED ORDER — SODIUM BICARBONATE 8.4 % IV SOLN
INTRAVENOUS | Status: DC | PRN
  Administered 2020-07-18: 2 mL via EPIDURAL
  Administered 2020-07-18 (×2): 5 mL via EPIDURAL

## 2020-07-18 MED ORDER — ONDANSETRON HCL 4 MG/2ML IJ SOLN
INTRAMUSCULAR | Status: AC
  Filled 2020-07-18: qty 2

## 2020-07-18 SURGICAL SUPPLY — 22 items
CLIP FILSHIE TUBAL LIGA STRL (Clip) ×1 IMPLANT
CLOTH BEACON ORANGE TIMEOUT ST (SAFETY) ×2 IMPLANT
DRSG OPSITE POSTOP 3X4 (GAUZE/BANDAGES/DRESSINGS) ×2 IMPLANT
DURAPREP 26ML APPLICATOR (WOUND CARE) ×2 IMPLANT
GLOVE BIO SURGEON STRL SZ7.5 (GLOVE) ×2 IMPLANT
GLOVE BIOGEL PI IND STRL 7.0 (GLOVE) ×2 IMPLANT
GLOVE BIOGEL PI INDICATOR 7.0 (GLOVE) ×2
GOWN STRL REUS W/TWL LRG LVL3 (GOWN DISPOSABLE) ×2 IMPLANT
GOWN STRL REUS W/TWL XL LVL3 (GOWN DISPOSABLE) ×2 IMPLANT
NEEDLE HYPO 22GX1.5 SAFETY (NEEDLE) ×2 IMPLANT
NS IRRIG 1000ML POUR BTL (IV SOLUTION) ×2 IMPLANT
PACK ABDOMINAL MINOR (CUSTOM PROCEDURE TRAY) ×2 IMPLANT
PROTECTOR NERVE ULNAR (MISCELLANEOUS) ×2 IMPLANT
SPONGE LAP 4X18 RFD (DISPOSABLE) IMPLANT
SUT MNCRL AB 4-0 PS2 18 (SUTURE) ×2 IMPLANT
SUT PLAIN 0 NONE (SUTURE) ×2 IMPLANT
SUT VIC AB 0 CT1 27 (SUTURE) ×1
SUT VIC AB 0 CT1 27XBRD ANBCTR (SUTURE) ×1 IMPLANT
SYR CONTROL 10ML LL (SYRINGE) ×2 IMPLANT
TOWEL OR 17X24 6PK STRL BLUE (TOWEL DISPOSABLE) ×4 IMPLANT
TRAY FOLEY CATH SILVER 14FR (SET/KITS/TRAYS/PACK) ×2 IMPLANT
WATER STERILE IRR 1000ML POUR (IV SOLUTION) ×2 IMPLANT

## 2020-07-18 NOTE — H&P (Addendum)
OB ADMISSION/ HISTORY & PHYSICAL:  Admission Date: 07/18/2020  2:47 AM  Admit Diagnosis: CTX 74mins    Kristin Mcdaniel is a 25 y.o. female presenting for SOL at [redacted]w[redacted]d. She is uncomfortable and desires an epidural.   Prenatal History: G2P1001   EDC : 07/23/2020, by Last Menstrual Period  Prenatal care at [facility] since 1st trimester  Prenatal course complicated by: Benign heart murmur Pilonidal abscess Acute cystitis without hematuria  Prenatal Labs: ABO, Rh: O (11/02 0917)  Antibody: NO ANTIBODIES DETECTED (11/02 0917) Rubella: 7.79 (11/02 0917)  RPR: NON-REACTIVE (03/03 0000)  HBsAg: NON-REACTIVE (11/02 0917)  HIV: NON-REACTIVE (03/03 0000)  GBS:   Positive 06/29/2020 1 hr Glucola : 135, 2hr Glucola 89 Fasting: 84 Genetic Screening: normal  Ultrasound: Sono at [employer], posterior placenta, presentation Breech, AFI normal.   Vaccines: TDaP          UTD         Flu             Declined                    COVID-19 UTD    Maternal Diabetes: No Genetic Screening: Normal Maternal Ultrasounds/Referrals: Normal Fetal Ultrasounds or other Referrals:  None Maternal Substance Abuse:  No Significant Maternal Medications:  None Significant Maternal Lab Results:  Group B Strep positive Other Comments:  None  Medical / Surgical History :  Past medical history:  Past Medical History:  Diagnosis Date   Medical history non-contributory      Past surgical history:  Past Surgical History:  Procedure Laterality Date   NO PAST SURGERIES       Family History:  Family History  Problem Relation Age of Onset   Diabetes Mother    Hyperlipidemia Mother    Diabetes Maternal Grandmother    Hyperlipidemia Maternal Grandmother      Social History:  reports that she has never smoked. She has never used smokeless tobacco. She reports that she does not drink alcohol and does not use drugs.   Allergies: Patient has no known allergies.   Current Medications at time of admission:   Medications Prior to Admission  Medication Sig Dispense Refill Last Dose   Prenatal Vit-Fe Fumarate-FA (PRENATAL VITAMIN PO) Take by mouth.        Review of Systems: Review of Systems  Constitutional: Negative for chills, fever and weight loss.  HENT: Negative for sore throat.   Eyes: Negative for blurred vision, double vision and photophobia.  Respiratory: Negative for cough, shortness of breath, wheezing and stridor.   Cardiovascular: Negative for chest pain.  Gastrointestinal: Positive for abdominal pain. Negative for heartburn, nausea and vomiting.  Musculoskeletal: Negative for falls.  Neurological: Negative for dizziness, tingling, seizures, loss of consciousness, weakness and headaches.  Psychiatric/Behavioral: Negative for depression, hallucinations, substance abuse and suicidal ideas. The patient is not nervous/anxious.     Physical Exam: Vital signs and nursing notes reviewed.  No data found.   General: AAO x 3, NAD, coping well. Breathing through contractions well.  Heart: RRR Lungs:CTAB Abdomen: Gravid, NT, Leopold's cephalic, confirmed by SVE.  Extremities: mild edema Genitalia / VE: Dilation: 8 Effacement (%): 100 Station: 0 Presentation: Vertex Exam by:: K.Wilson,RN   FHR: 145 BPM, moderate variability, present accels, early decels with contractions. TOCO: Ctx q1-4 moderate to palpation.   Labs:   Pending T&S, CBC, RPR  No results for input(s): WBC, HGB, HCT, PLT in the last 72 hours.   Assessment:  25 y.o. G2P1001 at [redacted]w[redacted]d  1. Active stage of labor 2. FHR category II, reassuring  3. GBS Positive 4. Desires Epidural, Circ for baby and PPBTL 5. Breastfeeding 6. Placenta disposal L&D  Plan:  1. Admit to BS 2. Routine L&D orders  -GBS positive, 2gr ampicillin for GBS prophylaxis,  3. Analgesia/anesthesia PRN   -Pt may have epidural upon request.  4. Expectant management for adequate GBS tx.  5. Anticipate NSVB   Dr Harolyn Rutherford notified of  admission / plan of care   Shantonette Isaias Sakai) Rollene Rotunda, BSN, RNC-OB  Student Nurse-Midwife   07/18/2020  7:02 AM   I personally saw and evaluated the patient, performing the key elements of the service. I developed and verified the management plan that is described in the resident's/student's note, and I agree with the content with my edits above. VSS, HRR&R, Resp unlabored, Legs neg.  Nigel Berthold, CNM 07/18/2020 7:36 AM

## 2020-07-18 NOTE — MAU Note (Addendum)
Patient brought directly from lobby to room due to frequency and intensity of contractions. +FM.  FHR 150's Cervical exam: 8cm 100% 0 station with bulging bag of water Provider called and admission orders received.  Covid swab obtained, IV and labs completed. Patient transported to LD accompanied by Judeth Horn, NP

## 2020-07-18 NOTE — Progress Notes (Signed)
Patient desires bilateral tubal sterilization.  Other reversible forms of contraception were discussed with patient; she declines all other modalities. Discussed bilateral tubal sterilization in detail; discussed options of  bilateral tubal sterilization using Filshie clips, Pomeroy method, and bilateral salpingectomy. Risks and benefits discussed in detail including but not limited to: risk of regret, permanence of method, bleeding, infection, injury to surrounding organs and need for additional procedures.  Failure risk of 1-2 % for Filshie clips and <1% for bilateral salpingectomy with increased risk of ectopic gestation if pregnancy occurs was also discussed with patient.  Also discussed possible reduction of risk of ovarian cancer via bilateral salpingectomy given that a growing body of knowledge reveals that the majority of cases of high grade serous "ovarian" cancer actually are actually  cancers arising from the fimbriated end of the fallopian tubes. Emphasized that removal of fallopian tubes do not result in any known hormonal imbalance.  Patient verbalized understanding of these risks and benefits and wants to proceed with sterilization with bilateral salpingectomy, postpartum

## 2020-07-18 NOTE — Anesthesia Preprocedure Evaluation (Signed)
Anesthesia Evaluation  Patient identified by MRN, date of birth, ID band Patient awake    Reviewed: Allergy & Precautions, Patient's Chart, lab work & pertinent test results  Airway Mallampati: III  TM Distance: >3 FB Neck ROM: Full    Dental no notable dental hx. (+) Teeth Intact   Pulmonary neg pulmonary ROS,    Pulmonary exam normal breath sounds clear to auscultation       Cardiovascular Normal cardiovascular exam+ Valvular Problems/Murmurs  Rhythm:Regular Rate:Normal     Neuro/Psych negative neurological ROS  negative psych ROS   GI/Hepatic Neg liver ROS, GERD  ,  Endo/Other  Obesity  Renal/GU negative Renal ROS  negative genitourinary   Musculoskeletal negative musculoskeletal ROS (+)   Abdominal (+) + obese,   Peds  Hematology negative hematology ROS (+)   Anesthesia Other Findings   Reproductive/Obstetrics (+) Pregnancy                             Anesthesia Physical Anesthesia Plan  ASA: II  Anesthesia Plan: Epidural   Post-op Pain Management:    Induction:   PONV Risk Score and Plan:   Airway Management Planned: Natural Airway  Additional Equipment:   Intra-op Plan:   Post-operative Plan:   Informed Consent: I have reviewed the patients History and Physical, chart, labs and discussed the procedure including the risks, benefits and alternatives for the proposed anesthesia with the patient or authorized representative who has indicated his/her understanding and acceptance.       Plan Discussed with: Anesthesiologist  Anesthesia Plan Comments:         Anesthesia Quick Evaluation

## 2020-07-18 NOTE — Anesthesia Postprocedure Evaluation (Signed)
Anesthesia Post Note  Patient: Kristin Mcdaniel  Procedure(s) Performed: POST PARTUM TUBAL LIGATION (N/A Abdomen)     Patient location during evaluation: PACU Anesthesia Type: Epidural Level of consciousness: awake and alert and oriented Pain management: pain level controlled Vital Signs Assessment: post-procedure vital signs reviewed and stable Respiratory status: spontaneous breathing, nonlabored ventilation and respiratory function stable Cardiovascular status: blood pressure returned to baseline and stable Postop Assessment: no headache, no backache, patient able to bend at knees, epidural receding and no apparent nausea or vomiting Anesthetic complications: no   No complications documented.  Last Vitals:  Vitals:   07/18/20 1500 07/18/20 1515  BP: 123/61 122/61  Pulse: 73 75  Resp: 15 19  Temp: 36.4 C   SpO2: 98% 97%    Last Pain:  Vitals:   07/18/20 1545  TempSrc:   PainSc: 1    Pain Goal:    LLE Motor Response: Purposeful movement (07/18/20 1545) LLE Sensation: Tingling (07/18/20 1545) RLE Motor Response: Purposeful movement (07/18/20 1545) RLE Sensation: Tingling (07/18/20 1545)     Epidural/Spinal Function Cutaneous sensation: Able to Wiggle Toes (07/18/20 1545), Patient able to flex knees: No (07/18/20 1545), Patient able to lift hips off bed: No (07/18/20 1545), Back pain beyond tenderness at insertion site: No (07/18/20 1545), Progressively worsening motor and/or sensory loss: No (07/18/20 1545), Bowel and/or bladder incontinence post epidural: No (07/18/20 1545)  Lannie Fields

## 2020-07-18 NOTE — Anesthesia Procedure Notes (Signed)
Epidural Patient location during procedure: OB Start time: 07/18/2020 4:34 AM End time: 07/18/2020 4:42 AM  Staffing Anesthesiologist: Mal Amabile, MD Performed: anesthesiologist   Preanesthetic Checklist Completed: patient identified, IV checked, site marked, risks and benefits discussed, surgical consent, monitors and equipment checked, pre-op evaluation and timeout performed  Epidural Patient position: sitting Prep: DuraPrep and site prepped and draped Patient monitoring: continuous pulse ox and blood pressure Approach: midline Location: L3-L4 Injection technique: LOR air  Needle:  Needle type: Tuohy  Needle gauge: 17 G Needle length: 9 cm and 9 Needle insertion depth: 4 cm Catheter type: closed end flexible Catheter size: 19 Gauge Catheter at skin depth: 9 cm Test dose: negative and Other  Assessment Events: blood not aspirated, injection not painful, no injection resistance, no paresthesia and negative IV test  Additional Notes Patient identified. Risks and benefits discussed including failed block, incomplete  Pain control, post dural puncture headache, nerve damage, paralysis, blood pressure Changes, nausea, vomiting, reactions to medications-both toxic and allergic and post Partum back pain. All questions were answered. Patient expressed understanding and wished to proceed. Sterile technique was used throughout procedure. Epidural site was Dressed with sterile barrier dressing. No paresthesias, signs of intravascular injection Or signs of intrathecal spread were encountered.  Patient was more comfortable after the epidural was dosed. Please see RN's note for documentation of vital signs and FHR which are stable. Reason for block:procedure for pain

## 2020-07-18 NOTE — Progress Notes (Signed)
Kristin Mcdaniel is a 25 y.o. G2P1001 at [redacted]w[redacted]d admitted for active labor  Subjective: comfortable with epidural; does not feel urge to push yet  Objective: BP (!) 102/53   Pulse 72   Temp 98.5 F (36.9 C) (Oral)   Resp 20   LMP 10/17/2019   SpO2 99%  No intake/output data recorded.  FHR baseline 135 bpm, Variability: moderate, Accelerations:present, Decelerations:  Absent Toco: irregular, every 1-3 minutes   SVE:   Dilation: Lip/rim Effacement (%): 90 Station: -2 Exam by:: Mattel RN    Labs: Lab Results  Component Value Date   WBC 9.8 07/18/2020   HGB 12.5 07/18/2020   HCT 37.5 07/18/2020   MCV 87.8 07/18/2020   PLT 186 07/18/2020    Assessment / Plan: Spontaneous labor, progressing normally; will give second dose of ampicillin and then break water  Labor: active Fetal Wellbeing:  Category I Pain Control:  epidural Pre-eclampsia: N/A I/D:  Ampicillin for GBS+ Anticipated MOD: NSVB  Marylene Land CNM, WHNP-BC 07/18/2020, 8:59 AM

## 2020-07-18 NOTE — Transfer of Care (Signed)
Immediate Anesthesia Transfer of Care Note  Patient: Kristin Mcdaniel  Procedure(s) Performed: POST PARTUM TUBAL LIGATION (N/A Abdomen)  Patient Location: PACU  Anesthesia Type:Epidural  Level of Consciousness: awake, alert  and oriented  Airway & Oxygen Therapy: Patient Spontanous Breathing  Post-op Assessment: Report given to RN and Post -op Vital signs reviewed and stable  Post vital signs: Reviewed and stable  Last Vitals:  Vitals Value Taken Time  BP 128/59 07/18/20 1457  Temp    Pulse 77 07/18/20 1459  Resp 21 07/18/20 1459  SpO2 98 % 07/18/20 1459  Vitals shown include unvalidated device data.  Last Pain:  Vitals:   07/18/20 1345  TempSrc:   PainSc: 0-No pain         Complications: No complications documented.

## 2020-07-18 NOTE — Lactation Note (Addendum)
This note was copied from a baby's chart. Lactation Consultation Note Baby 11 hrs old. Baby has no interest in BF at this time. FOB interpreting for mom. FOB stated 5 pm was last time baby BF. They have tried several time since then but baby wouldn't feed. LC stimulated baby to feed. Mom latched and suckled a few times then went to sleep. Mom has flat nipples. Some edema noted. Reverse pressure helpful. Shells given to wear in am as well as pre-pumping before latching to evert nipple. Newborn feeding habits, behavior, STS, I&O, supply and demand discussed. Mom encouraged to feed baby 8-12 times/24 hours and with feeding cues.  Lactation brochure given in [place] and [place]. Encouraged to call for assistance or questions.  Mom was unable to latch her first child so she was formula fed.  Patient Name: Kristin Mcdaniel [id number] Date: 07/18/2020 Reason for consult: Initial assessment;Term;1st time breastfeeding Age:92 hours  Maternal Data    Feeding    LATCH Score Latch: Too sleepy or reluctant, no latch achieved, no sucking elicited.  Audible Swallowing: None  Type of Nipple: Flat  Comfort (Breast/Nipple): Soft / non-tender  Hold (Positioning): Full assist, staff holds infant at breast  LATCH Score: 3   Lactation Tools Discussed/Used    Interventions Interventions: Breast feeding basics reviewed;Support pillows;Assisted with latch;Position options;Skin to skin;Breast massage;Shells;Pre-pump if needed;Reverse pressure;Hand pump;Breast compression;Adjust position  Discharge [facility] Program: Yes  Consult Status Consult Status: Follow-up Date: 07/19/20 Follow-up type: In-patient    Charyl Dancer 07/18/2020, 11:44 PM

## 2020-07-18 NOTE — Discharge Summary (Signed)
Postpartum Discharge Summary     Patient Name: Kristin Mcdaniel DOB: Sep 23, 1995 MRN: [id number]  Date of admission: 07/18/2020 Delivery date:07/18/2020  Delivering provider: Starr Lake  Date of discharge: 07/19/2020  Admitting diagnosis: Pregnancy [Z34.90] Intrauterine pregnancy: [redacted]w[redacted]d     Secondary diagnosis:  Active Problems:   Pregnancy   Second degree perineal laceration during delivery, delivered  Additional problems: None    Discharge diagnosis: Term Pregnancy Delivered                                              Post partum procedures:postpartum tubal ligation Augmentation: AROM Complications: None  Hospital course: Onset of Labor With Vaginal Delivery      25 y.o. yo G2P1001 at [redacted]w[redacted]d was admitted in Active Labor on 07/18/2020. Patient had an uncomplicated labor course as follows:  Membrane Rupture Time/Date: 9:27 AM ,07/18/2020   Delivery Method:Vaginal, Spontaneous  Episiotomy: None  Lacerations:  2nd degree;Vaginal;Perineal  Patient had an uncomplicated postpartum course.  She is ambulating, tolerating a regular diet, passing flatus, and urinating well. Patient is discharged home in stable condition on 07/19/20.  Newborn Data: Birth date:07/18/2020  Birth time:12:26 PM  Gender:Female  Living status:Living  Apgars:7 ,9  Weight:3090 g   Magnesium Sulfate received: No BMZ received: No Rhophylac:No MMR:No T-DaP:Given prenatally Flu: Declined Transfusion:No  Physical exam  Vitals:   07/18/20 1612 07/18/20 1715 07/18/20 2030 07/19/20 0500  BP: (!) 122/59 122/73 123/72 112/71  Pulse: 70 84 72 77  Resp: $Remo'18 18 18 19  'KTmtt$ Temp: 98.5 F (36.9 C) 98 F (36.7 C) 98.9 F (37.2 C) 97.9 F (36.6 C)  TempSrc: Oral Oral Oral Oral  SpO2: 100% 100% 100% 98%    General: alert, cooperative and no distress Lochia: appropriate Uterine Fundus: firm Incision: dressing clean, dry, intact DVT Evaluation: No evidence of DVT seen on physical exam. Negative  Homan's sign. No cords or calf tenderness. No significant calf/ankle edema. Labs: Lab Results  Component Value Date   WBC 9.8 07/18/2020   HGB 12.5 07/18/2020   HCT 37.5 07/18/2020   MCV 87.8 07/18/2020   PLT 186 07/18/2020   CMP Latest Ref Rng & Units 01/31/2018  Glucose 70 - 99 mg/dL 75  BUN 6 - 20 mg/dL 9  Creatinine 0.44 - 1.00 mg/dL 0.48  Sodium 135 - 145 mmol/L 136  Potassium 3.5 - 5.1 mmol/L 3.6  Chloride 98 - 111 mmol/L 108  CO2 22 - 32 mmol/L 19(L)  Calcium 8.9 - 10.3 mg/dL 9.7  Total Protein 6.5 - 8.1 g/dL 6.8  Total Bilirubin 0.3 - 1.2 mg/dL 0.5  Alkaline Phos 38 - 126 U/L 217(H)  AST 15 - 41 U/L 16  ALT 0 - 44 U/L 13   Edinburgh Score: Edinburgh Postnatal Depression Scale Screening Tool 07/18/2020  I have been able to laugh and see the funny side of things. 0  I have looked forward with enjoyment to things. 0  I have blamed myself unnecessarily when things went wrong. 1  I have been anxious or worried for no good reason. 0  I have felt scared or panicky for no good reason. 0  Things have been getting on top of me. 0  I have been so unhappy that I have had difficulty sleeping. 0  I have felt sad or miserable. 0  I have been so unhappy  that I have been crying. 0  The thought of harming myself has occurred to me. 0  Edinburgh Postnatal Depression Scale Total 1     After visit meds:  Allergies as of 07/19/2020   No Known Allergies     Medication List    TAKE these medications   ibuprofen 600 MG tablet Commonly known as: ADVIL Take 1 tablet (600 mg total) by mouth every 6 (six) hours.   oxyCODONE-acetaminophen 5-325 MG tablet Commonly known as: PERCOCET/ROXICET Take 1 tablet by mouth every 6 (six) hours as needed for severe pain.   PRENATAL VITAMIN PO Take by mouth.        Discharge home in stable condition Infant Feeding: Breast Infant Disposition:home with mother Discharge instruction: per After Visit Summary and Postpartum  booklet. Activity: Advance as tolerated. Pelvic rest for 6 weeks.  Diet: routine diet Future Appointments:No future appointments. Follow up Visit:  Angel[facility] for [place] at [facility] Follow up.   Specialty: Obstetrics and Gynecology Why: In 1 week for incision check and 4 weeks with a provider for postpartum exam Contact information: [place], [place] [phone]               Please schedule this patient for a In person postpartum visit in 6 weeks with the following provider: APP. Additional Postpartum F/U:Need pap smear pp  Low risk pregnancy complicated by: nothing Delivery mode:  Vaginal, Spontaneous  Anticipated Birth Control:  BTL done PP   Message sent to [facility] to schedule for one week f/u after BTL.   07/19/2020 Clarisa Fling, NP

## 2020-07-18 NOTE — Lactation Note (Signed)
This note was copied from a baby's chart. Lactation Consultation Note  Patient Name: Boy Dianca Owensby [id number] Date: 07/18/2020 Reason for consult: L&D Initial assessment;Term;1st time breastfeeding;[place]apara Age:26 hours   L&D Initial Lactation Visit:  Visited with family < 1 hour after birth: Father assisting with interpretation  RN in room and mother ready for lactation assistance.  Assisted to latch to the left breast easily.  Baby eager to begin feeding.  Demonstrated correct hand/finger placement for mother when holding baby. Observed for 5 minutes prior to exiting the room.  Reassured parents that lactation services will be provided on the M/B unit.  Allowed time for family bonding.   Maternal Data    Feeding Mother's Current Feeding Choice: Breast Milk  LATCH Score Latch: Grasps breast easily, tongue down, lips flanged, rhythmical sucking.  Audible Swallowing: None  Type of Nipple: Everted at rest and after stimulation  Comfort (Breast/Nipple): Soft / non-tender  Hold (Positioning): Assistance needed to correctly position infant at breast and maintain latch.  LATCH Score: 7   Lactation Tools Discussed/Used    Interventions Interventions: Assisted with latch;Skin to skin  Discharge    Consult Status Consult Status: Follow-up Date: 07/18/20 Follow-up type: In-patient    Karlen Barbar R Audry Pecina 07/18/2020, 1:15 PM

## 2020-07-18 NOTE — Anesthesia Preprocedure Evaluation (Signed)
Anesthesia Evaluation  Patient identified by MRN, date of birth, ID band Patient awake    Reviewed: Allergy & Precautions, Patient's Chart, lab work & pertinent test results  Airway Mallampati: II  TM Distance: >3 FB Neck ROM: Full    Dental no notable dental hx.    Pulmonary neg pulmonary ROS,    Pulmonary exam normal breath sounds clear to auscultation       Cardiovascular negative cardio ROS Normal cardiovascular exam Rhythm:Regular Rate:Normal     Neuro/Psych negative neurological ROS  negative psych ROS   GI/Hepatic negative GI ROS, Neg liver ROS,   Endo/Other  negative endocrine ROS  Renal/GU negative Renal ROS  negative genitourinary   Musculoskeletal negative musculoskeletal ROS (+)   Abdominal   Peds negative pediatric ROS (+)  Hematology negative hematology ROS (+) hct 37.5, plt 186   Anesthesia Other Findings   Reproductive/Obstetrics Desires sterility                             Anesthesia Physical Anesthesia Plan  ASA: II  Anesthesia Plan: Epidural   Post-op Pain Management:    Induction:   PONV Risk Score and Plan: 2 and Ondansetron, Dexamethasone and Treatment may vary due to age or medical condition  Airway Management Planned: Natural Airway  Additional Equipment: None  Intra-op Plan:   Post-operative Plan:   Informed Consent: I have reviewed the patients History and Physical, chart, labs and discussed the procedure including the risks, benefits and alternatives for the proposed anesthesia with the patient or authorized representative who has indicated his/her understanding and acceptance.       Plan Discussed with: CRNA  Anesthesia Plan Comments:         Anesthesia Quick Evaluation

## 2020-07-18 NOTE — Op Note (Signed)
Kristin Mcdaniel 07/18/2020  PREOPERATIVE DIAGNOSES: Multiparity, undesired fertility  POSTOPERATIVE DIAGNOSES: Multiparity, undesired fertility  PROCEDURE:  Postpartum Bilateral salpingectomy   SURGEON: Dr. Nettie Elm  ANESTHESIA:  Epidural and local analgesia using 30 ml of 0.25% Marcaine  COMPLICATIONS:  None immediate.  ESTIMATED BLOOD LOSS: < 25 cc  FLUIDS: As recorded  URINE OUTPUT:  As recorded  INDICATIONS:  25 y.o. [id number] with undesired fertility,status post vaginal delivery, desires permanent sterilization.  Other reversible forms of contraception were discussed with patient; she declines all other modalities. Risks of procedure discussed with patient including but not limited to: risk of regret, permanence of method, bleeding, infection, injury to surrounding organs and need for additional procedures.  Failure risk of 1 -2 % with increased risk of ectopic gestation if pregnancy occurs was also discussed with patient.      FINDINGS:  Normal uterus, tubes, and ovaries.  PROCEDURE DETAILS: The patient was taken to the operating room where her epidural anesthesia was dosed up to surgical level and found to be adequate.  She was then placed in the dorsal supine position and prepped and draped in sterile fashion.  After an adequate timeout was performed, attention was turned to the patient's abdomen where a small transverse skin incision was made under the umbilical fold. The incision was taken down to the layer of fascia using the scalpel, and fascia was incised, and extended bilaterally using Mayo scissors. The peritoneum was entered in a sharp fashion. Attention was then turned to the patient's uterus, and left fallopian tube was identified and followed out to the fimbriated end.  A Kelly clamp was placed across the mesosalpinx, being sure to include the fimbria,  and was double ligated with plain gut suture. The tube was the cut  And removed. Hemostasis was noted. The procedure was  repeat on the other side in the same fashion. Each tube was passed off to pathology. The instruments were then removed from the patient's abdomen and the fascial incision was repaired with 0 Vicryl, and the skin was closed with a 4-0 Vicryl subcuticular stitch. 30 cc of 0.25 % Marcaine was injected at the incision site. The patient tolerated the procedure well.  Instrument, sponge, and needle counts were correct times two.  The patient was then taken to the recovery room awake and in stable condition.    Nettie Elm, MD, FACOG Attending Obstetrician & Gynecologist Faculty Practice, [facility]

## 2020-07-19 MED ORDER — IBUPROFEN 600 MG PO TABS: 600.0000 mg | ORAL_TABLET | Freq: Four times a day (QID) | ORAL | 0 refills | Status: DC

## 2020-07-19 MED ORDER — OXYCODONE-ACETAMINOPHEN 5-325 MG PO TABS: 1.0000 | ORAL_TABLET | Freq: Four times a day (QID) | ORAL | 0 refills | Status: DC | PRN

## 2020-07-19 NOTE — Lactation Note (Addendum)
This note was copied from a baby's chart. Lactation Consultation Note  Patient Name: Kristin Mcdaniel [id number] Date: 07/19/2020 Reason for consult: Follow-up assessment;Term Age:26 hours Consult was done in Spanish:  Follow up visit to 28 hours old with 2.91% weight loss at the time of visit. Mother states breastfeeding is improving. Infant also took ~32mL of formula via bottle. Infant has been been having good voids and stools, per mother.  Feeding plan:  1-Skin to skin 2-Aim for a deep, comfortable latch 3-Breastfeeding on demand or 8-12 times in 24h period. 4-Keep infant awake during breastfeeding session: massaging breast, infant's hand/shoulder/feet 5-Pump or hand-express and offer EBM prior to supplementation. 6-If needed, supplement following guidelines, paced bottle feeding and fullness cues.  7-Monitor voids and stools as signs good intake.  8-Encouraged maternal rest, hydration and food intake.  9-Contact LC as needed for feeds/support/concerns/questions  All questions answered at this time. Family is waiting to be discharged home today.  Feeding Mother's Current Feeding Choice: Breast Milk and Formula  Interventions Interventions: Breast feeding basics reviewed;Education;Skin to skin;Expressed milk  Discharge Discharge Education: Engorgement and breast care;Warning signs for feeding baby WIC Program: Yes  Consult Status Consult Status: Complete Date: 07/19/20 Follow-up type: Call as needed    Kristin Mcdaniel 07/19/2020, 4:57 PM

## 2020-07-19 NOTE — Discharge Instructions (Signed)

## 2020-07-19 NOTE — Progress Notes (Signed)
Post Partum Day 1 Subjective: no complaints, up ad lib, voiding, tolerating PO and pt has not passed gas or had a bowel movement since delivery. Spanish translator used for entire visit. BTL completed, pt does not want circumcision.  Objective: Blood pressure 112/71, pulse 77, temperature 97.9 F (36.6 C), temperature source Oral, resp. rate 19, last menstrual period 10/17/2019, SpO2 98 %, unknown if currently breastfeeding.  Patient Vitals for the past 24 hrs:  BP Temp Temp src Pulse Resp SpO2  07/19/20 0500 112/71 97.9 F (36.6 C) Oral 77 19 98 %  07/18/20 2030 123/72 98.9 F (37.2 C) Oral 72 18 100 %  07/18/20 1715 122/73 98 F (36.7 C) Oral 84 18 100 %  07/18/20 1612 (!) 122/59 98.5 F (36.9 C) Oral 70 18 100 %  07/18/20 1545 120/61 -- -- 62 13 98 %  07/18/20 1530 116/62 -- -- 66 16 97 %  07/18/20 1515 122/61 -- -- 75 19 97 %  07/18/20 1500 123/61 97.6 F (36.4 C) -- 73 15 98 %   Physical Exam:  General: alert, cooperative and no distress Lochia: appropriate Uterine Fundus: firm Incision: healing well DVT Evaluation: No evidence of DVT seen on physical exam. Negative Homan's sign. No cords or calf tenderness. No significant calf/ankle edema.  Recent Labs    07/18/20 0313  HGB 12.5  HCT 37.5    Assessment/Plan: Plan for discharge tomorrow and Breastfeeding. No circumcision prior to discharge.   LOS: 1 day   Kristin Mcdaniel 07/19/2020, 2:25 PM

## 2020-07-19 NOTE — Anesthesia Postprocedure Evaluation (Signed)
Anesthesia Post Note  Patient: Kristin Mcdaniel  Procedure(s) Performed: AN AD HOC LABOR EPIDURAL     Patient location during evaluation: Mother Baby Anesthesia Type: Epidural Level of consciousness: awake and alert Pain management: pain level controlled Vital Signs Assessment: post-procedure vital signs reviewed and stable Respiratory status: spontaneous breathing, nonlabored ventilation and respiratory function stable Cardiovascular status: stable Postop Assessment: no headache, no backache and epidural receding Anesthetic complications: no   No complications documented.  Last Vitals:  Vitals:   07/18/20 2030 07/19/20 0500  BP: 123/72 112/71  Pulse: 72 77  Resp: 18 19  Temp: 37.2 C 36.6 C  SpO2: 100% 98%    Last Pain:  Vitals:   07/19/20 0830  TempSrc:   PainSc: 6    Pain Goal:                Epidural/Spinal Function Cutaneous sensation: Normal sensation (07/19/20 0830), Patient able to flex knees: Yes (07/19/20 0830), Patient able to lift hips off bed: Yes (07/19/20 0830), Back pain beyond tenderness at insertion site: No (07/19/20 0830), Progressively worsening motor and/or sensory loss: No (07/19/20 0830), Bowel and/or bladder incontinence post epidural: No (07/19/20 0830)  [facility] Records

## 2020-07-21 ENCOUNTER — Telehealth: Payer: Self-pay | Admitting: General Practice

## 2020-07-21 NOTE — Telephone Encounter (Signed)
Transition Care Management Unsuccessful Follow-up Telephone Call  Date of discharge and from where:  [facility]. 07/19/20  Attempts:  1st Attempt  Reason for unsuccessful TCM follow-up call:  Left voice message

## 2020-07-22 ENCOUNTER — Ambulatory Visit: Payer: BC Managed Care – PPO | Admitting: Osteopathic Medicine

## 2020-07-22 LAB — SURGICAL PATHOLOGY

## 2020-07-22 NOTE — Telephone Encounter (Signed)
Transition Care Management Unsuccessful Follow-up Telephone Call  Date of discharge and from where:  [facility]  Attempts:  2nd Attempt  Reason for unsuccessful TCM follow-up call:  Left voice message

## 2020-07-23 NOTE — Telephone Encounter (Signed)
Transition Care Management Unsuccessful Follow-up Telephone Call  Date of discharge and from where:  [facility]  Attempts:  3rd Attempt  Reason for unsuccessful TCM follow-up call:  Left voice message

## 2020-07-24 ENCOUNTER — Encounter: Payer: Self-pay | Admitting: Obstetrics and Gynecology

## 2020-07-24 ENCOUNTER — Other Ambulatory Visit: Payer: Self-pay

## 2020-07-24 ENCOUNTER — Ambulatory Visit (INDEPENDENT_AMBULATORY_CARE_PROVIDER_SITE_OTHER): Payer: BC Managed Care – PPO | Admitting: Obstetrics and Gynecology

## 2020-07-24 VITALS — BP 117/70 | HR 80 | Resp 16 | Ht 60.0 in | Wt 176.0 lb

## 2020-07-24 DIAGNOSIS — Z5189 Encounter for other specified aftercare: Secondary | ICD-10-CM

## 2020-07-24 NOTE — Progress Notes (Signed)
25 yo P2 presenting today for incision check following SVD followed by bilateral salpingectomy on 07/18/20. Patient reports feeling well with minimal discomforts. She denies any leakage from the incision, fever or chill. Patient denies any concerns related to postpartum depression. Patient reports that breastfeeding is going well. Patient is without any complaints  Past Medical History:  Diagnosis Date  . Medical history non-contributory    Past Surgical History:  Procedure Laterality Date  . NO PAST SURGERIES    . TUBAL LIGATION N/A 07/18/2020   Procedure: POST PARTUM TUBAL LIGATION;  Surgeon: Hermina Staggers, MD;  Location: [facility] LD ORS;  Service: Gynecology;  Laterality: N/A;   Family History  Problem Relation Age of Onset  . Diabetes Mother   . Hyperlipidemia Mother   . Diabetes Maternal Grandmother   . Hyperlipidemia Maternal Grandmother    Social History   Tobacco Use  . Smoking status: Never Smoker  . Smokeless tobacco: Never Used  Substance Use Topics  . Alcohol use: No    Comment: occasional, social drinking, limited   . Drug use: No   ROS See pertinent in HPI. All other systems reviewed and non contributory  Blood pressure 117/70, pulse 80, resp. rate 16, height 5' (1.524 m), weight 176 lb (79.8 kg), last menstrual period 10/17/2019, currently breastfeeding. GENERAL: Well-developed, well-nourished female in no acute distress.  ABDOMEN: Soft, nontender, nondistended. No organomegaly. Incision: healing well without erythema, induration or drainage EXTREMITIES: No cyanosis, clubbing, or edema, 2+ distal pulses.   A/P 25 yo here for incision check following postpartum salpingectomy - Reassurance provided - Patient to follow up as scheduled for postpartum visit

## 2020-09-04 NOTE — Progress Notes (Signed)
Post Partum Visit Note  Kristin Mcdaniel is a 25 y.o. [id number] female who presents for a postpartum visit. She is 7 weeks postpartum following a normal spontaneous vaginal delivery.  I have fully reviewed the prenatal and intrapartum course. The delivery was at 39.2 gestational weeks.  Anesthesia: epidural. Postpartum course has been unremarkable. Baby is doing well. Baby is feeding by bottle Rush Barer . Bleeding no bleeding. Bowel function is normal. Bladder function is normal. Patient is not sexually active. Contraception method is tubal ligation. Postpartum depression screening: negative.  Reports pain in RLQ, intermittent, sharp, when she moves around.  The pregnancy intention screening data noted above was reviewed.   Edinburgh Postnatal Depression Scale - 09/05/20 0841       Edinburgh Postnatal Depression Scale:  In the Past 7 Days   I have been able to laugh and see the funny side of things. 0    I have looked forward with enjoyment to things. 0    I have blamed myself unnecessarily when things went wrong. 0    I have been anxious or worried for no good reason. 0    I have felt scared or panicky for no good reason. 0    Things have been getting on top of me. 0    I have been so unhappy that I have had difficulty sleeping. 0    I have felt sad or miserable. 0    I have been so unhappy that I have been crying. 0    The thought of harming myself has occurred to me. 0    Edinburgh Postnatal Depression Scale Total 0             Health Maintenance Due  Topic Date Due   HPV VACCINES (1 - 2-dose series) Never done   COVID-19 Vaccine (3 - Booster) 02/26/2020   PAP-Cervical Cytology Screening  05/13/2020   PAP SMEAR-Modifier  05/13/2020    The following portions of the patient's history were reviewed and updated as appropriate: allergies, current medications, past family history, past medical history, past social history, past surgical history, and problem list.  Review of  Systems Pertinent items are noted in HPI.  Objective:  BP 123/83   Pulse 62   Ht 5\' 2"  (1.575 m)   Wt 170 lb (77.1 kg)   LMP 10/17/2019   Breastfeeding No   BMI 31.09 kg/m    General:  alert, cooperative, and no distress   Breasts:  not indicated  Lungs: NWOB  Heart:  Normal rate  Abdomen:  soft, non-tender; no masses,  no organomegaly and umbilical incision well healed  Wound healed  GU exam:   Laceration well healed, vagina rugated, pink, moist, papsmear done       Assessment:   1. Screening for cervical cancer   2. Postpartum exam   3.      RLQ pain  Plan:   Essential components of care per ACOG recommendations:  1.  Mood and well being: Patient with negative depression screening today. Reviewed local resources for support.  - Patient tobacco use? No.   - hx of drug use? No.    2. Infant care and feeding:  -Patient currently breastmilk feeding? No.  -Social determinants of health (SDOH) reviewed in EPIC. No concerns The following needs were identified none  3. Sexuality, contraception and birth spacing - Patient does not want a pregnancy in the next year.  Desired family size is 2 children.  - s/p  BTL  4. Sleep and fatigue -Encouraged family/partner/community support of 4 hrs of uninterrupted sleep to help with mood and fatigue  5. Physical Recovery  - Discussed patients delivery and complications. She describes her labor as good. - Patient had a Vaginal, no problems at delivery. Patient had a 2nd degree laceration. Perineal healing reviewed. Patient expressed understanding - Patient has urinary incontinence? No. - Patient is safe to resume physical and sexual activity  6.  Health Maintenance - HM due items addressed Yes - Last pap smear 2019 Diagnosis  Date Value Ref Range Status  05/13/2017   Final   NEGATIVE FOR INTRAEPITHELIAL LESIONS OR MALIGNANCY.   Pap smear done at today's visit.  -Breast Cancer screening indicated? No.   7. Chronic  Disease/Pregnancy Condition follow up: None  - PCP follow up  8. RLQ pain - no signs of acute process - may be post-op related  - expect spontaneous resolution  Donette Larry, CNM [facility] [employer], [facility]

## 2020-09-05 ENCOUNTER — Other Ambulatory Visit: Payer: Self-pay

## 2020-09-05 ENCOUNTER — Ambulatory Visit (INDEPENDENT_AMBULATORY_CARE_PROVIDER_SITE_OTHER): Payer: BC Managed Care – PPO | Admitting: Certified Nurse Midwife

## 2020-09-05 ENCOUNTER — Other Ambulatory Visit (HOSPITAL_COMMUNITY)
Admission: RE | Admit: 2020-09-05 | Discharge: 2020-09-05 | Disposition: A | Discharge status: Home / Self Care | Payer: BC Managed Care – PPO | Source: Ambulatory Visit | Attending: Certified Nurse Midwife | Admitting: Certified Nurse Midwife

## 2020-09-05 VITALS — BP 123/83 | HR 62 | Ht 62.0 in | Wt 170.0 lb

## 2020-09-05 DIAGNOSIS — Z124 Encounter for screening for malignant neoplasm of cervix: Secondary | ICD-10-CM | POA: Diagnosis present

## 2020-09-08 LAB — CYTOLOGY - PAP: Diagnosis: NEGATIVE

## 2021-09-29 NOTE — Progress Notes (Unsigned)
   Complete physical exam  Patient: Kristin Mcdaniel   DOB: [birth year]   25 y.o. Female  MRN: [id number]  Subjective:    No chief complaint on file.   Kristin Mcdaniel is a 26 y.o. female who presents today for a complete physical exam. She reports consuming a {diet types:17450} diet. {types:19826} She generally feels {DESC; WELL/FAIRLY WELL/POORLY:18703}. She reports sleeping {DESC; WELL/FAIRLY WELL/POORLY:18703}. She {does/does not:200015} have additional problems to discuss today.    Most recent fall risk assessment:     No data to display           Most recent depression screenings:    05/13/2017    9:06 AM  PHQ 2/9 Scores  PHQ - 2 Score 2  PHQ- 9 Score 4    {VISON DENTAL STD PSA (Optional):27386}  {History (Optional):23778}  No care team member to display   Outpatient Medications Prior to Visit  Medication Sig   ibuprofen (ADVIL) 600 MG tablet Take 1 tablet (600 mg total) by mouth every 6 (six) hours. (Patient not taking: Reported on 09/05/2020)   oxyCODONE-acetaminophen (PERCOCET/ROXICET) 5-325 MG tablet Take 1 tablet by mouth every 6 (six) hours as needed for severe pain. (Patient not taking: Reported on 09/05/2020)   Prenatal Vit-Fe Fumarate-FA (PRENATAL VITAMIN PO) Take by mouth. (Patient not taking: Reported on 09/05/2020)   No facility-administered medications prior to visit.    ROS        Objective:     There were no vitals taken for this visit. {Vitals History (Optional):23777}  Physical Exam   No results found for any visits on 09/30/21. {Show previous labs (optional):23779}    Assessment & Plan:    Routine Health Maintenance and Physical Exam  Immunization History  Administered Date(s) Administered   Influenza,inj,Quad PF,6+ Mos 05/10/2016, 11/02/2017   Moderna Sars-Covid-2 Vaccination 09/05/2019, 09/26/2019   Tdap 11/02/2017, 06/10/2020    Health Maintenance  Topic Date Due   HPV VACCINES (1 - 2-dose series) Never done   COVID-19 Vaccine  (3 - Moderna series) 11/21/2019   INFLUENZA VACCINE  09/29/2021   PAP-Cervical Cytology Screening  09/06/2023   PAP SMEAR-Modifier  09/06/2023   TETANUS/TDAP  06/11/2030   Hepatitis C Screening  Completed   HIV Screening  Completed    Discussed health benefits of physical activity, and encouraged her to engage in regular exercise appropriate for her age and condition.  Problem List Items Addressed This Visit   None Visit Diagnoses     Encounter to establish care    -  Primary   Annual physical exam       Need for HPV vaccination          No follow-ups on file.     Christen Butter, NP

## 2021-09-30 ENCOUNTER — Encounter: Payer: Self-pay | Admitting: Medical-Surgical

## 2021-09-30 ENCOUNTER — Ambulatory Visit (INDEPENDENT_AMBULATORY_CARE_PROVIDER_SITE_OTHER): Payer: BC Managed Care – PPO | Admitting: Medical-Surgical

## 2021-09-30 ENCOUNTER — Other Ambulatory Visit: Payer: Self-pay | Admitting: Medical-Surgical

## 2021-09-30 VITALS — BP 121/77 | HR 77 | Resp 20 | Ht 62.0 in | Wt 181.4 lb

## 2021-09-30 DIAGNOSIS — Z7689 Persons encountering health services in other specified circumstances: Secondary | ICD-10-CM | POA: Diagnosis not present

## 2021-09-30 DIAGNOSIS — Z23 Encounter for immunization: Secondary | ICD-10-CM

## 2021-09-30 DIAGNOSIS — L83 Acanthosis nigricans: Secondary | ICD-10-CM

## 2021-09-30 DIAGNOSIS — Z0000 Encounter for general adult medical examination without abnormal findings: Secondary | ICD-10-CM

## 2021-10-01 LAB — CBC WITH DIFFERENTIAL/PLATELET
Absolute Monocytes: 360 cells/uL (ref 200–950)
Basophils Absolute: 27 cells/uL (ref 0–200)
Basophils Relative: 0.4 %
Eosinophils Absolute: 68 cells/uL (ref 15–500)
Eosinophils Relative: 1 %
HCT: 38.5 % (ref 35.0–45.0)
Hemoglobin: 13.1 g/dL (ref 11.7–15.5)
Lymphs Abs: 1693 cells/uL (ref 850–3900)
MCH: 29.9 pg (ref 27.0–33.0)
MCHC: 34 g/dL (ref 32.0–36.0)
MCV: 87.9 fL (ref 80.0–100.0)
MPV: 11.9 fL (ref 7.5–12.5)
Monocytes Relative: 5.3 %
Neutro Abs: 4651 cells/uL (ref 1500–7800)
Neutrophils Relative %: 68.4 %
Platelets: 237 10*3/uL (ref 140–400)
RBC: 4.38 10*6/uL (ref 3.80–5.10)
RDW: 12.7 % (ref 11.0–15.0)
Total Lymphocyte: 24.9 %
WBC: 6.8 10*3/uL (ref 3.8–10.8)

## 2021-10-01 LAB — COMPLETE METABOLIC PANEL WITH GFR
AG Ratio: 1.7 (calc) (ref 1.0–2.5)
ALT: 17 U/L (ref 6–29)
AST: 11 U/L (ref 10–30)
Albumin: 4.4 g/dL (ref 3.6–5.1)
Alkaline phosphatase (APISO): 82 U/L (ref 31–125)
BUN: 9 mg/dL (ref 7–25)
CO2: 25 mmol/L (ref 20–32)
Calcium: 9.7 mg/dL (ref 8.6–10.2)
Chloride: 105 mmol/L (ref 98–110)
Creat: 0.58 mg/dL (ref 0.50–0.96)
Globulin: 2.6 g/dL (calc) (ref 1.9–3.7)
Glucose, Bld: 100 mg/dL — ABNORMAL HIGH (ref 65–99)
Potassium: 4.2 mmol/L (ref 3.5–5.3)
Sodium: 138 mmol/L (ref 135–146)
Total Bilirubin: 0.4 mg/dL (ref 0.2–1.2)
Total Protein: 7 g/dL (ref 6.1–8.1)
eGFR: 128 mL/min/{1.73_m2} (ref >=60)

## 2021-10-01 LAB — LIPID PANEL
Cholesterol: 189 mg/dL (ref <200)
HDL: 46 mg/dL — ABNORMAL LOW (ref >=50)
LDL Cholesterol (Calc): 112 mg/dL (calc) — ABNORMAL HIGH
Non-HDL Cholesterol (Calc): 143 mg/dL (calc) — ABNORMAL HIGH (ref <130)
Total CHOL/HDL Ratio: 4.1 (calc) (ref <5.0)
Triglycerides: 195 mg/dL — ABNORMAL HIGH (ref <150)

## 2021-10-01 LAB — HEMOGLOBIN A1C
Hgb A1c MFr Bld: 5.2 % of total Hgb (ref <5.7)
Mean Plasma Glucose: 103 mg/dL
eAG (mmol/L): 5.7 mmol/L

## 2021-10-13 IMAGING — US US MFM OB DETAIL+14 WK
1 series · 13 of 28 positions shown · non-contrast
Comparison: none

[Series 1: us mfm ob detail+14 wk · 13 of 150 slices shown]
[im 6/150]
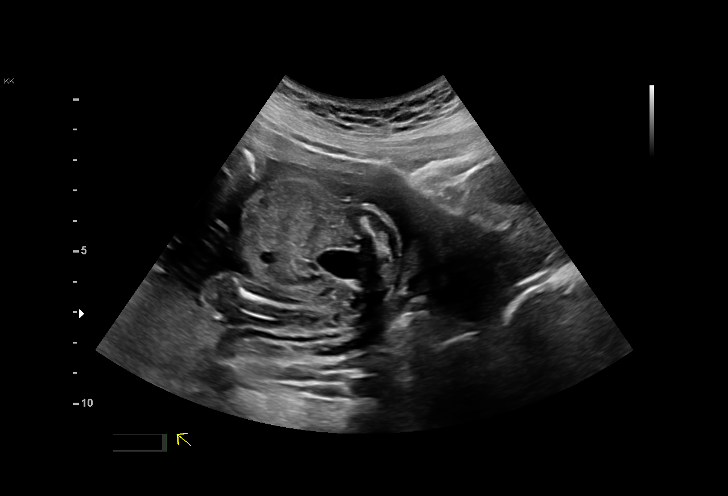
[im 17/150]
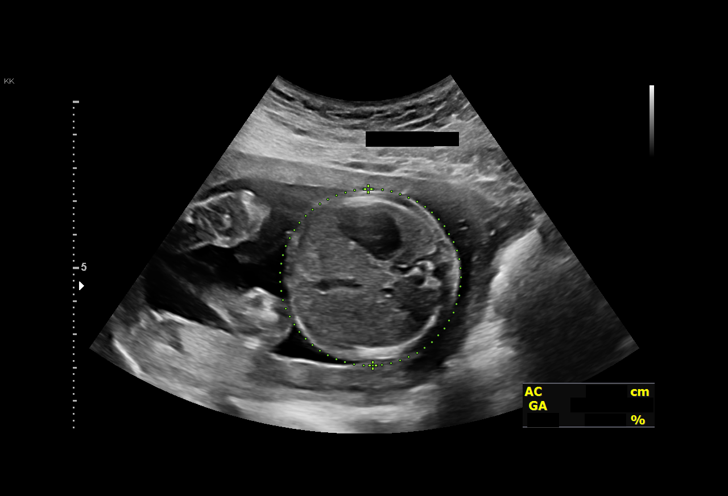
[im 28/150]
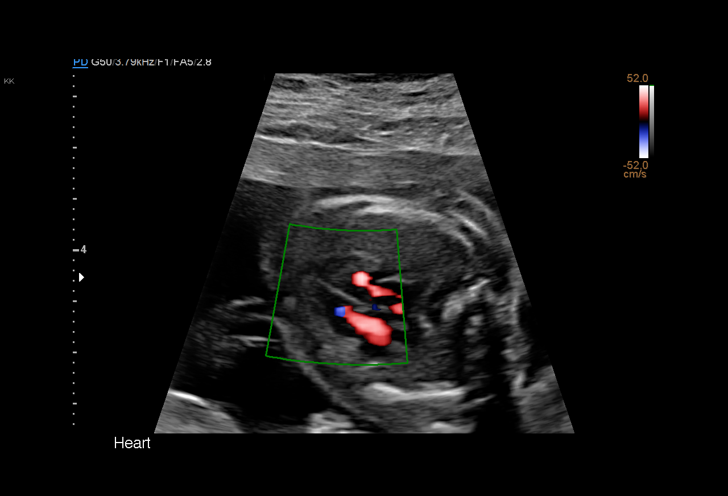
[im 39/150]
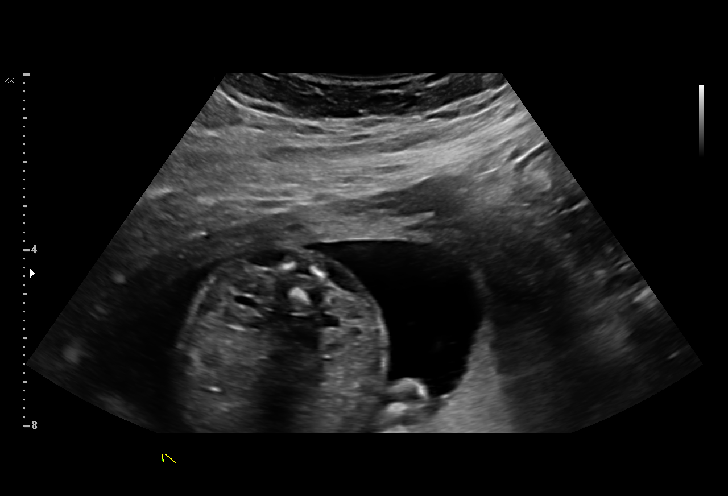
[im 50/150]
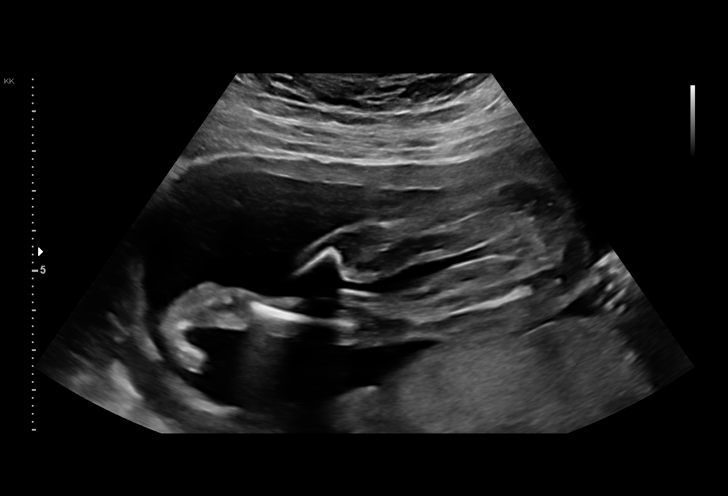
[im 61/150]
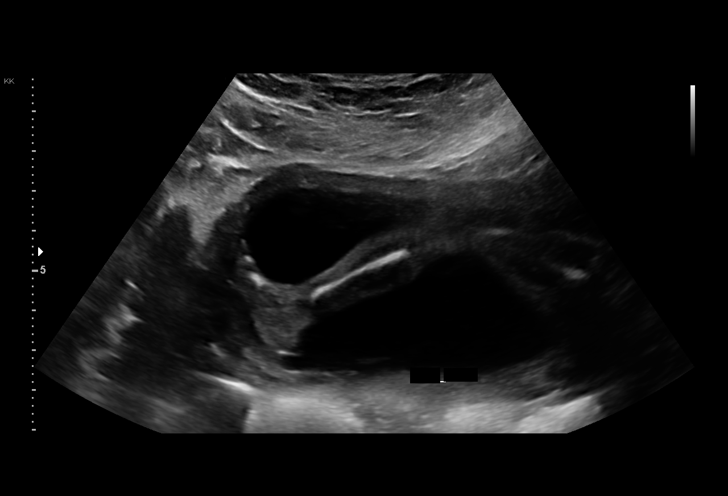
[im 78/150]
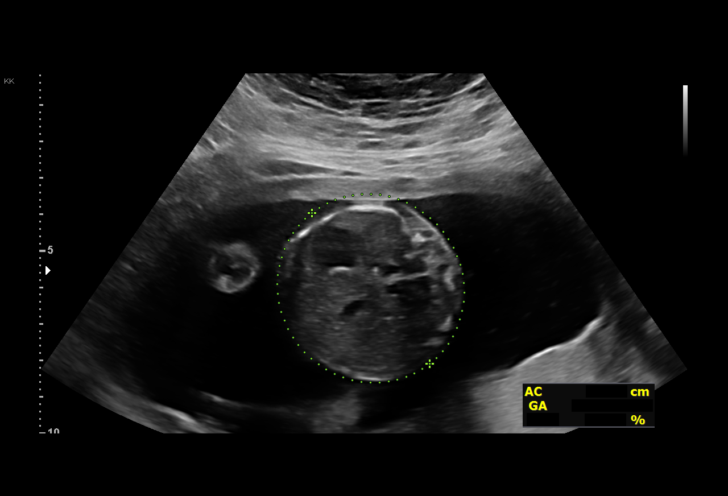
[im 89/150]
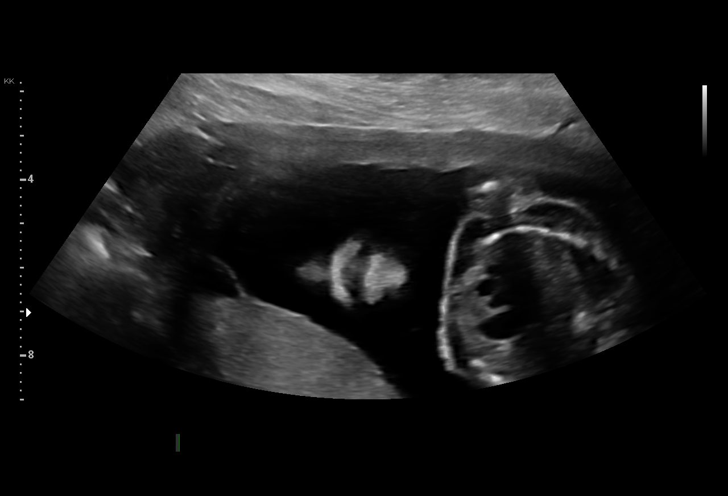
[im 100/150]
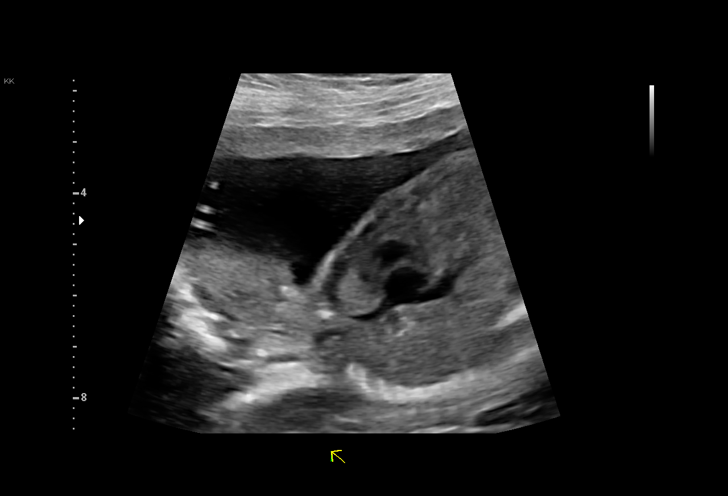
[im 111/150]
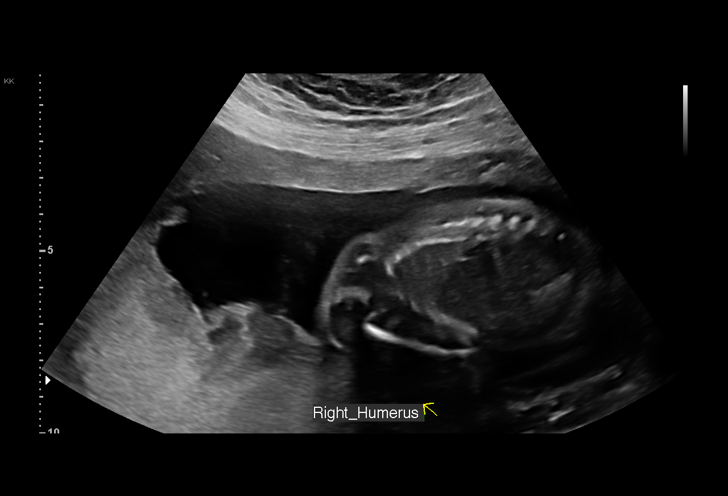
[im 122/150]
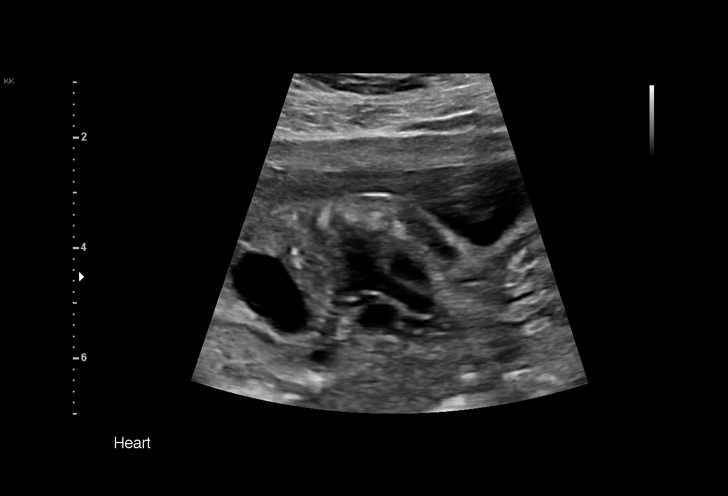
[im 133/150]
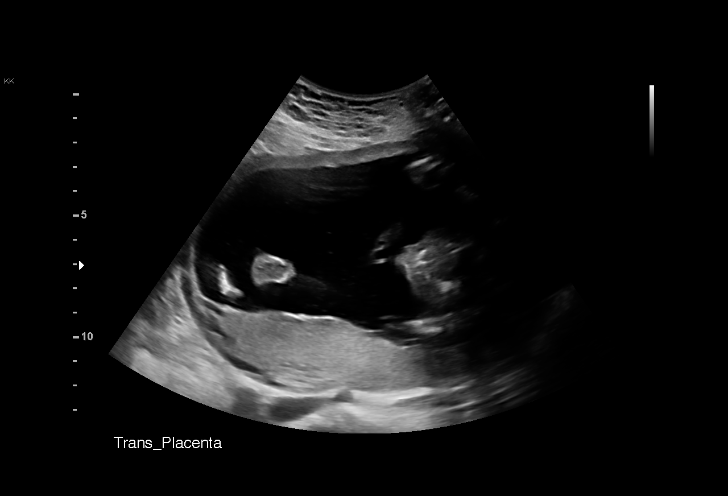
[im 144/150]
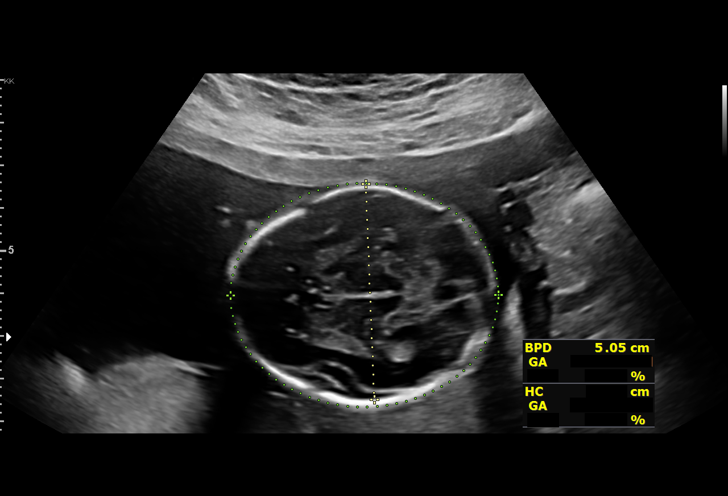

[13 of 28 positions shown; findings below may reference images not displayed]

Indications

 Obesity complicating pregnancy, second
 trimester
 Negative AFP (Low Risk NIPS)
 Encounter for antenatal screening for
 malformations
 20 weeks gestation of pregnancy
Vital Signs

 BMI:
Fetal Evaluation

 Num Of Fetuses:         1
 Fetal Heart Rate(bpm):  147
 Cardiac Activity:       Observed
 Presentation:           Breech
 Placenta:               Posterior
 P. Cord Insertion:      Visualized

 Amniotic Fluid
 AFI FV:      Within normal limits

                             Largest Pocket(cm)

Biometry

 BPD:      50.7  mm     G. Age:  21w 3d         87  %    CI:        76.98   %    70 - 86
                                                         FL/HC:      17.1   %    16.8 -
 HC:       183   mm     G. Age:  20w 5d         60  %    HC/AC:      1.12        1.09 -
 AC:      163.2  mm     G. Age:  21w 3d         79  %    FL/BPD:     61.7   %
 FL:       31.3  mm     G. Age:  19w 5d         23  %    FL/AC:      19.2   %    20 - 24
 HUM:      31.3  mm     G. Age:  20w 3d         55  %
 NFT:       2.8  mm

 Est. FW:     369  gm    0 lb 13 oz      67  %
OB History

 Gravidity:    2         Term:   1        Prem:   0        SAB:   0
 TOP:          0       Ectopic:  0        Living: 1
Gestational Age

 LMP:           20w 2d        Date:  10/17/19                 EDD:   07/23/20
 U/S Today:     20w 6d                                        EDD:   07/19/20
 Best:          20w 2d     Det. By:  LMP  (10/17/19)          EDD:   07/23/20
Anatomy

 Cranium:               Appears normal         Aortic Arch:            Appears normal
 Cavum:                 Appears normal         Ductal Arch:            Appears normal
 Ventricles:            Appears normal         Diaphragm:              Appears normal
 Choroid Plexus:        Appears normal         Stomach:                Appears normal, left
                                                                       sided
 Cerebellum:            Appears normal         Abdomen:                Appears normal
 Posterior Fossa:       Appears normal         Abdominal Wall:         Appears nml (cord
                                                                       insert, abd wall)
 Nuchal Fold:           Appears normal         Cord Vessels:           Appears normal (3
                                                                       vessel cord)
 Face:                  Appears normal         Kidneys:                Appear normal
                        (orbits and profile)
 Lips:                  Appears normal         Bladder:                Appears normal
 Thoracic:              Appears normal         Spine:                  Appears normal
 Heart:                 Appears normal         Upper Extremities:      Appears normal
                        (4CH, axis, and
                        situs)
 RVOT:                  Appears normal         Lower Extremities:      Appears normal
 LVOT:                  Appears normal

 Other:  Fetus appears a male. Technically difficult due to fetal position.
Cervix Uterus Adnexa

 Cervix
 Length:            3.2  cm.
 Normal appearance by transabdominal scan.
Impression

 Single intrauterine pregnancy here for a detailed anatomy
 due BMI > 30
 Normal anatomy with measurements consistent with dates
 There is good fetal movement and amniotic fluid volume
Recommendations

 Follow up as clinically indicated.

## 2021-10-14 LAB — HM PAP SMEAR: HM Pap smear: NEGATIVE

## 2021-10-15 LAB — THINPREP® IMAGING PAP (REFL) HPV MRNA E6/E7 RFLX HPV16,18/45

## 2021-10-16 ENCOUNTER — Ambulatory Visit: Payer: BC Managed Care – PPO | Admitting: Obstetrics and Gynecology

## 2021-11-23 ENCOUNTER — Encounter: Payer: Self-pay | Admitting: Medical-Surgical

## 2022-07-19 ENCOUNTER — Telehealth: Payer: Self-pay | Admitting: Emergency Medicine

## 2022-07-19 NOTE — Telephone Encounter (Signed)
Call back to pt & her husband to confirm last known Tdap- 2019 & 2022. Pt had stepped on a nail earlier today. Area cleaned, antibiotic cream & bandaid applied. Pt to follow up w/ PCP or at [facility] if she notices any redness or purulent discharge. No other questions at this time
